# Patient Record
Sex: Female | Born: 1985
Health system: Southern US, Community
[De-identification: ages and names within clinical notes are randomized; demographics above are authoritative.]

## PROBLEM LIST (undated history)

## (undated) DIAGNOSIS — G932 Benign intracranial hypertension: Secondary | ICD-10-CM

## (undated) DIAGNOSIS — R55 Syncope and collapse: Secondary | ICD-10-CM

## (undated) HISTORY — PX: CSF SHUNT: SHX92

---

## 2011-07-17 ENCOUNTER — Ambulatory Visit: Payer: Self-pay | Admitting: Emergency Medicine

## 2016-02-23 DIAGNOSIS — R6889 Other general symptoms and signs: Secondary | ICD-10-CM | POA: Diagnosis not present

## 2016-02-23 DIAGNOSIS — Z111 Encounter for screening for respiratory tuberculosis: Secondary | ICD-10-CM | POA: Diagnosis not present

## 2016-02-23 DIAGNOSIS — R569 Unspecified convulsions: Secondary | ICD-10-CM | POA: Diagnosis not present

## 2016-03-04 DIAGNOSIS — R109 Unspecified abdominal pain: Secondary | ICD-10-CM | POA: Diagnosis not present

## 2016-03-04 DIAGNOSIS — R110 Nausea: Secondary | ICD-10-CM | POA: Diagnosis not present

## 2016-03-04 DIAGNOSIS — R1084 Generalized abdominal pain: Secondary | ICD-10-CM | POA: Diagnosis not present

## 2016-03-04 DIAGNOSIS — F17210 Nicotine dependence, cigarettes, uncomplicated: Secondary | ICD-10-CM | POA: Diagnosis not present

## 2016-03-04 DIAGNOSIS — R1013 Epigastric pain: Secondary | ICD-10-CM | POA: Diagnosis not present

## 2016-03-04 DIAGNOSIS — K529 Noninfective gastroenteritis and colitis, unspecified: Secondary | ICD-10-CM | POA: Diagnosis not present

## 2016-03-04 DIAGNOSIS — R509 Fever, unspecified: Secondary | ICD-10-CM | POA: Diagnosis not present

## 2016-03-04 DIAGNOSIS — R0602 Shortness of breath: Secondary | ICD-10-CM | POA: Diagnosis not present

## 2016-03-04 DIAGNOSIS — Z982 Presence of cerebrospinal fluid drainage device: Secondary | ICD-10-CM | POA: Diagnosis not present

## 2016-03-04 DIAGNOSIS — K6389 Other specified diseases of intestine: Secondary | ICD-10-CM | POA: Diagnosis not present

## 2016-03-04 DIAGNOSIS — R10816 Epigastric abdominal tenderness: Secondary | ICD-10-CM | POA: Diagnosis not present

## 2016-05-29 DIAGNOSIS — N938 Other specified abnormal uterine and vaginal bleeding: Secondary | ICD-10-CM | POA: Diagnosis not present

## 2016-05-29 DIAGNOSIS — M25551 Pain in right hip: Secondary | ICD-10-CM | POA: Diagnosis not present

## 2016-05-29 DIAGNOSIS — D539 Nutritional anemia, unspecified: Secondary | ICD-10-CM | POA: Diagnosis not present

## 2016-06-02 DIAGNOSIS — N938 Other specified abnormal uterine and vaginal bleeding: Secondary | ICD-10-CM | POA: Diagnosis not present

## 2016-06-02 DIAGNOSIS — M25851 Other specified joint disorders, right hip: Secondary | ICD-10-CM | POA: Diagnosis not present

## 2016-06-02 DIAGNOSIS — M25551 Pain in right hip: Secondary | ICD-10-CM | POA: Diagnosis not present

## 2016-06-02 DIAGNOSIS — M160 Bilateral primary osteoarthritis of hip: Secondary | ICD-10-CM | POA: Diagnosis not present

## 2016-06-02 DIAGNOSIS — N83292 Other ovarian cyst, left side: Secondary | ICD-10-CM | POA: Diagnosis not present

## 2016-07-05 ENCOUNTER — Encounter: Payer: Self-pay | Admitting: *Deleted

## 2016-07-05 ENCOUNTER — Ambulatory Visit
Admission: EM | Admit: 2016-07-05 | Discharge: 2016-07-05 | Disposition: A | Discharge status: Home / Self Care | Payer: Medicare Other | Attending: Family Medicine | Admitting: Family Medicine

## 2016-07-05 DIAGNOSIS — R05 Cough: Secondary | ICD-10-CM

## 2016-07-05 DIAGNOSIS — R0981 Nasal congestion: Secondary | ICD-10-CM | POA: Diagnosis not present

## 2016-07-05 DIAGNOSIS — R0602 Shortness of breath: Secondary | ICD-10-CM

## 2016-07-05 DIAGNOSIS — J45901 Unspecified asthma with (acute) exacerbation: Secondary | ICD-10-CM

## 2016-07-05 HISTORY — DX: Syncope and collapse: R55

## 2016-07-05 HISTORY — DX: Benign intracranial hypertension: G93.2

## 2016-07-05 MED ORDER — PREDNISONE 10 MG PO TABS: ORAL_TABLET | ORAL | 0 refills | Status: DC

## 2016-07-05 MED ORDER — IPRATROPIUM-ALBUTEROL 0.5-2.5 (3) MG/3ML IN SOLN
3.0000 mL | Freq: Four times a day (QID) | RESPIRATORY_TRACT | Status: DC
  Administered 2016-07-05: 3 mL via RESPIRATORY_TRACT

## 2016-07-05 MED ORDER — BENZONATATE 100 MG PO CAPS: 100.0000 mg | ORAL_CAPSULE | Freq: Three times a day (TID) | ORAL | 0 refills | Status: DC | PRN

## 2016-07-05 MED ORDER — ALBUTEROL SULFATE HFA 108 (90 BASE) MCG/ACT IN AERS: 2.0000 | INHALATION_SPRAY | RESPIRATORY_TRACT | 0 refills | Status: DC | PRN

## 2016-07-05 MED ORDER — HYDROCOD POLST-CPM POLST ER 10-8 MG/5ML PO SUER: 5.0000 mL | Freq: Every evening | ORAL | 0 refills | Status: DC | PRN

## 2016-07-05 NOTE — ED Triage Notes (Signed)
Patient started having symptoms of chest congestion, cough, and SOB 2 days ago. Patient has a history of breathing issues.

## 2016-07-05 NOTE — Discharge Instructions (Signed)
Take medication as prescribed. Rest. Drink plenty of fluids.  ° °Follow up with your primary care physician this week as needed. Return to Urgent care for new or worsening concerns.  ° °

## 2016-07-05 NOTE — ED Provider Notes (Signed)
[facility] ____________________________________________  Time seen: Approximately 10:23 AM  I have reviewed the triage vital signs and the nursing notes.   HISTORY  Chief Complaint Cough and Shortness of Breath   HPI Kristi Li is a 31 y.o. female  presents for evaluation of cough, wheezing and shortness of breath. Patient reports the symptoms started on Monday afternoon while she was at work. 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 States unsure if this is definitely a trigger. Patient reports coughing has continued with intermittent wheezing. Patient states that she has some soreness in her chest from coughing that is tender with direct palpation. Denies pain to breathe. Reports has had a history of similar in the past with asthmatic bronchitis. Reports she is having some nasal congestion and some ear congestion and ear discomfort. Denies sore throat. Denies fevers. States cough is primarily a dry hacking cough. States no over-the-counter medications for the same complaints. States did not have albuterol inhaler at home. Denies current shortness of breath. Reports mild soreness to needed to bilateral anterior chest with straight palpation as well as with coughing. Denies pain radiation or paresthesias.  Denies chest pain, abdominal pain, dysuria, extremity pain, extremity swelling or rash. Denies recent sickness. Denies recent antibiotic use.   Patient's last menstrual period was 07/04/2016. Denies pregnancy.  Shelbie Hutching, MD: PCP   Past Medical History:  Diagnosis Date  . Pseudotumor cerebri   . Vasovagal syncope     There are no active problems to display for this patient.   Past Surgical History:  Procedure Laterality Date  . CSF SHUNT        Current Facility-Administered Medications:  .  ipratropium-albuterol  (DUONEB) 0.5-2.5 (3) MG/3ML nebulizer solution 3 mL, 3 mL, Nebulization, Q6H, Marylene Land, NP, 3 mL at 07/05/16 1058 .  ipratropium-albuterol (DUONEB) 0.5-2.5 (3) MG/3ML nebulizer solution 3 mL, 3 mL, Nebulization, Q6H, Marylene Land, NP, 3 mL at 07/05/16 1047  Current Outpatient Prescriptions:  .  albuterol (PROVENTIL HFA;VENTOLIN HFA) 108 (90 Base) MCG/ACT inhaler, Inhale 2 puffs into the lungs every 4 (four) hours as needed for wheezing., Disp: 1 Inhaler, Rfl: 0 .  benzonatate (TESSALON PERLES) 100 MG capsule, Take 1 capsule (100 mg total) by mouth 3 (three) times daily as needed for cough., Disp: 15 capsule, Rfl: 0 .  chlorpheniramine-HYDROcodone (TUSSIONEX PENNKINETIC ER) 10-8 MG/5ML SUER, Take 5 mLs by mouth at bedtime as needed for cough. do not drive or operate machinery while taking as can cause drowsiness., Disp: 75 mL, Rfl: 0 .  predniSONE (DELTASONE) 10 MG tablet, Start 60 mg po day one, then 50 mg po day two, taper by 10 mg daily until complete., Disp: 21 tablet, Rfl: 0  Allergies Doxycycline  History reviewed. No pertinent family history.  Social History Social History  Substance Use Topics  . Smoking status: Current Every Day Smoker    Types: Cigarettes  . Smokeless tobacco: Never Used  . Alcohol use Yes    Review of Systems Constitutional: No fever/chills Eyes: No visual changes. ENT: No sore throat. Cardiovascular: Denies chest pain. Respiratory: As above.  Gastrointestinal: No abdominal pain.  No nausea, no vomiting.  No diarrhea.  No constipation. Genitourinary: Negative for dysuria. Musculoskeletal: Negative for back pain. Skin: Negative for rash.   ____________________________________________   PHYSICAL EXAM:  VITAL SIGNS: ED Triage Vitals  Enc Vitals Group  BP 07/05/16 1014 126/67     Pulse Rate 07/05/16 1014 87     Resp 07/05/16 1014 18     Temp 07/05/16 1014 99 F (37.2 C)     Temp Source 07/05/16 1014 Oral     SpO2 07/05/16 1014 100 %      Weight 07/05/16 1014 213 lb (96.6 kg)     Height 07/05/16 1014 5\' 3"  (1.6 m)     Head Circumference --      Peak Flow --      Pain Score 07/05/16 1016 6     Pain Loc --      Pain Edu? --      Excl. in [place]? --     Constitutional: Alert and oriented. Well appearing and in no acute distress. Eyes: Conjunctivae are normal. PERRL. EOMI. Head: Atraumatic. No sinus tenderness to palpation. No swelling. No erythema.  Ears: no erythema, normal TMs bilaterally.   Nose:Nasal congestion with clear rhinorrhea  Mouth/Throat: Mucous membranes are moist. No pharyngeal erythema. No tonsillar swelling or exudate.  Neck: No stridor.  No cervical spine tenderness to palpation. Hematological/Lymphatic/Immunilogical: No cervical lymphadenopathy. Cardiovascular: Normal rate, regular rhythm. Grossly normal heart sounds.  Good peripheral circulation. Respiratory: Normal respiratory effort.  No retractions. Good air movement. Scattered inspiratory wheezes throughout. No rhonchi or rales. Dry intermittent cough noted in room with bronchospasm. Gastrointestinal: Soft and nontender Musculoskeletal: Ambulatory with steady gait. No cervical, thoracic or lumbar tenderness to palpation. Bilateral lower extremities nontender no edema noted. Bilateral pedal pulses equal and easily palpated. Neurologic:  Normal speech and language. No gait instability. Skin:  Skin appears warm, dry and intact. No rash noted. Psychiatric: Mood and affect are normal. Speech and behavior are normal.  Well's score 0.  ___________________________________________   LABS (all labs ordered are listed, but only abnormal results are displayed)  Labs Reviewed - No data to display ____________________________________________  PROCEDURES Procedures    INITIAL IMPRESSION / ASSESSMENT AND PLAN / ED COURSE  Pertinent labs & imaging results that were available during my care of the patient were reviewed by me and considered in my medical  decision making (see chart for details).  Well-appearing patient. No acute distress. 2 days of cough and wheezing. Suspect asthmatic bronchitis secondary to asthma history and unsure if triggered by aerosols Monday at work or if her viral illness. Wells score 0. Discussed with patient evaluation of d-dimer, patient declines. After 2 DuoNeb in urgent care, wheezes fully resolved and patient reports feeling much better. Will treat patient with oral prednisone taper, when necessary albuterol inhaler, when necessary Tessalon Perles and when necessary Tussionex at night. Reports note given for today. Encouraged rest, fluids and supportive care. Discussed strict follow-up and return parameters.Discussed indication, risks and benefits of medications with patient.  Discussed follow up with Primary care physician this week. Discussed follow up and return parameters including no resolution or any worsening concerns. Patient verbalized understanding and agreed to plan.   ____________________________________________   FINAL CLINICAL IMPRESSION(S) / ED DIAGNOSES  Final diagnoses:  Exacerbation of asthma, unspecified asthma severity, unspecified whether persistent  Nasal congestion     Discharge Medication List as of 07/05/2016 11:29 AM    START taking these medications   Details  albuterol (PROVENTIL HFA;VENTOLIN HFA) 108 (90 Base) MCG/ACT inhaler Inhale 2 puffs into the lungs every 4 (four) hours as needed for wheezing., Starting Wed 07/05/2016, Normal    benzonatate (TESSALON PERLES) 100 MG capsule Take 1 capsule (100 mg total)  by mouth 3 (three) times daily as needed for cough., Starting Wed 07/05/2016, Normal    chlorpheniramine-HYDROcodone (TUSSIONEX PENNKINETIC ER) 10-8 MG/5ML SUER Take 5 mLs by mouth at bedtime as needed for cough. do not drive or operate machinery while taking as can cause drowsiness., Starting Wed 07/05/2016, Print    predniSONE (DELTASONE) 10 MG tablet Start 60 mg po day one,  then 50 mg po day two, taper by 10 mg daily until complete., Normal        Note: This dictation was prepared with Dragon dictation along with smaller phrase technology. Any transcriptional errors that result from this process are unintentional.         Marylene Land, NP 07/05/16 1155

## 2016-09-27 DIAGNOSIS — D392 Neoplasm of uncertain behavior of placenta: Secondary | ICD-10-CM | POA: Diagnosis not present

## 2016-09-27 DIAGNOSIS — N939 Abnormal uterine and vaginal bleeding, unspecified: Secondary | ICD-10-CM | POA: Diagnosis not present

## 2016-10-10 DIAGNOSIS — M7061 Trochanteric bursitis, right hip: Secondary | ICD-10-CM | POA: Diagnosis not present

## 2016-10-10 DIAGNOSIS — M1611 Unilateral primary osteoarthritis, right hip: Secondary | ICD-10-CM | POA: Diagnosis not present

## 2016-10-12 DIAGNOSIS — Z3042 Encounter for surveillance of injectable contraceptive: Secondary | ICD-10-CM | POA: Diagnosis not present

## 2017-01-11 DIAGNOSIS — G932 Benign intracranial hypertension: Secondary | ICD-10-CM | POA: Diagnosis not present

## 2017-01-11 DIAGNOSIS — H47292 Other optic atrophy, left eye: Secondary | ICD-10-CM | POA: Diagnosis not present

## 2017-01-16 DIAGNOSIS — M25551 Pain in right hip: Secondary | ICD-10-CM | POA: Diagnosis not present

## 2017-01-16 DIAGNOSIS — Z3042 Encounter for surveillance of injectable contraceptive: Secondary | ICD-10-CM | POA: Diagnosis not present

## 2017-01-29 DIAGNOSIS — M25551 Pain in right hip: Secondary | ICD-10-CM | POA: Diagnosis not present

## 2017-01-29 DIAGNOSIS — M25561 Pain in right knee: Secondary | ICD-10-CM | POA: Diagnosis not present

## 2017-05-15 ENCOUNTER — Encounter: Payer: Self-pay | Admitting: *Deleted

## 2017-05-15 ENCOUNTER — Ambulatory Visit
Admission: EM | Admit: 2017-05-15 | Discharge: 2017-05-15 | Disposition: A | Discharge status: Home / Self Care | Payer: Medicare Other | Attending: Family Medicine | Admitting: Family Medicine

## 2017-05-15 DIAGNOSIS — M436 Torticollis: Secondary | ICD-10-CM | POA: Diagnosis not present

## 2017-05-15 MED ORDER — METAXALONE 800 MG PO TABS: 800.0000 mg | ORAL_TABLET | Freq: Three times a day (TID) | ORAL | 0 refills | Status: DC

## 2017-05-15 MED ORDER — DIAZEPAM 2 MG PO TABS: 2.0000 mg | ORAL_TABLET | Freq: Three times a day (TID) | ORAL | 0 refills | Status: DC

## 2017-05-15 MED ORDER — NAPROXEN 500 MG PO TABS: 500.0000 mg | ORAL_TABLET | Freq: Two times a day (BID) | ORAL | 0 refills | Status: DC

## 2017-05-15 MED ORDER — KETOROLAC TROMETHAMINE 60 MG/2ML IM SOLN
60.0000 mg | Freq: Once | INTRAMUSCULAR | Status: AC
  Administered 2017-05-15: 60 mg via INTRAMUSCULAR

## 2017-05-15 NOTE — ED Triage Notes (Signed)
Onset of thoracic region back pain this am. Denies injury.

## 2017-05-15 NOTE — ED Provider Notes (Signed)
[facility]    CSN: [id number] Arrival date & time: 05/15/17  1241     History   Chief Complaint Chief Complaint  Patient presents with  . Back Pain    HPI Kristi Li is a 32 y.o. female.   HPI  32 year old female presents with neck pain radiating to her right trapezius and infrascapularly along the medial scapular border of the right.  Does not remember any specific injury to her neck.  Does sleep with her 14-year-old daughter.  Works Engineer, agricultural at [employer].  Mostly on the right side.  States that she has a spasm in the right trapezius.  Any motion of the neck is very painful.  She has a decided leftward list.  Has a history of pseudotumor cerebri.  He denies any upper extremity radicular symptoms.                 Past Medical History:  Diagnosis Date  . Pseudotumor cerebri   . Vasovagal syncope     There are no active problems to display for this patient.   Past Surgical History:  Procedure Laterality Date  . CSF SHUNT      OB History   None      Home Medications    Prior to Admission medications   Medication Sig Start Date End Date Taking? Authorizing Provider  albuterol (PROVENTIL HFA;VENTOLIN HFA) 108 (90 Base) MCG/ACT inhaler Inhale 2 puffs into the lungs every 4 (four) hours as needed for wheezing. 07/05/16  Yes Marylene Land, NP  diazepam (VALIUM) 2 MG tablet Take 1 tablet (2 mg total) by mouth 3 (three) times daily. Take for muscle spasm first then begin taking Skelaxin afterwards 05/15/17   Lorin Picket, PA-C  metaxalone (SKELAXIN) 800 MG tablet Take 1 tablet (800 mg total) by mouth 3 (three) times daily. 05/15/17   Lorin Picket, PA-C  naproxen (NAPROSYN) 500 MG tablet Take 1 tablet (500 mg total) by mouth 2 (two) times daily with a meal. 05/15/17   Lorin Picket, PA-C    Family History Family History  Problem Relation Age of Onset  . Other Mother   . Sickle cell anemia Father     Social  History Social History   Tobacco Use  . Smoking status: Current Every Day Smoker    Types: Cigarettes  . Smokeless tobacco: Never Used  Substance Use Topics  . Alcohol use: Yes  . Drug use: No     Allergies   Doxycycline   Review of Systems Review of Systems  Constitutional: Positive for activity change. Negative for appetite change, chills, fatigue and fever.  Musculoskeletal: Positive for neck pain and neck stiffness.  All other systems reviewed and are negative.    Physical Exam Triage Vital Signs ED Triage Vitals  Enc Vitals Group     BP 05/15/17 1257 135/87     Pulse Rate 05/15/17 1257 60     Resp 05/15/17 1257 16     Temp 05/15/17 1257 99 F (37.2 C)     Temp Source 05/15/17 1257 Oral     SpO2 05/15/17 1257 100 %     Weight 05/15/17 1259 218 lb (98.9 kg)     Height 05/15/17 1259 5\' 3"  (1.6 m)     Head Circumference --      Peak Flow --      Pain Score 05/15/17 1259 10     Pain Loc --      Pain  Edu? --      Excl. in [place]? --    No data found.  Updated Vital Signs BP 135/87 (BP Location: Left Arm)   Pulse 60   Temp 99 F (37.2 C) (Oral)   Resp 16   Ht 5\' 3"  (1.6 m)   Wt 218 lb (98.9 kg)   SpO2 100%   BMI 38.62 kg/m   Visual Acuity Right Eye Distance:   Left Eye Distance:   Bilateral Distance:    Right Eye Near:   Left Eye Near:    Bilateral Near:     Physical Exam  Constitutional: She is oriented to person, place, and time. She appears well-developed and well-nourished. No distress.  HENT:  Head: Normocephalic.  Eyes: Pupils are equal, round, and reactive to light. Right eye exhibits no discharge. Left eye exhibits no discharge.  Neck:  Has a leftward list in the sitting position.  Range of motion is very uncomfortable and she has decreased range in all planes.  He has marked muscle spasm of the trapezius and painful tenderness along the right medial scapular border.  Strength is intact in the upper extremities.  She has a nondermatomal  stocking distribution decrease in sensation on the right.  Upper extremity DTRs equal and bilaterally symmetrical.  Musculoskeletal: Normal range of motion.  Neurological: She is alert and oriented to person, place, and time.  Skin: Skin is warm and dry. She is not diaphoretic.  Psychiatric: She has a normal mood and affect. Her behavior is normal. Judgment and thought content normal.  Nursing note and vitals reviewed.    UC Treatments / Results  Labs (all labs ordered are listed, but only abnormal results are displayed) Labs Reviewed - No data to display  EKG None Radiology No results found.  Procedures Procedures (including critical care time)  Medications Ordered in UC Medications  ketorolac (TORADOL) injection 60 mg (60 mg Intramuscular Given 05/15/17 1334)     Initial Impression / Assessment and Plan / UC Course  I have reviewed the triage vital signs and the nursing notes.  Pertinent labs & imaging results that were available during my care of the patient were reviewed by me and considered in my medical decision making (see chart for details).     Plan: 1. Test/x-ray results and diagnosis reviewed with patient 2. rx as per orders; risks, benefits, potential side effects reviewed with patient 3. Recommend supportive treatment with rest and symptom avoidance.  Use ice alternating with heat 20 minutes out of every 2 hours 3-4 times daily.  Begin using Valium 3 times daily for 2 days then switching over toSkelaxin thereafter.  Cautioned regarding use of these medications while performing activities requiring concentration or judgment and not driving while taking them.  Also provide her with anti-inflammatory medications.  If she is not improving she should follow-up with her primary care physician.  If she worsens she should go to the emergency room 4. F/u prn if symptoms worsen or don't improve   Final Clinical Impressions(s) / UC Diagnoses   Final diagnoses:   Torticollis, acute    ED Discharge Orders        Ordered    diazepam (VALIUM) 2 MG tablet  3 times daily     05/15/17 1333    metaxalone (SKELAXIN) 800 MG tablet  3 times daily     05/15/17 1333    naproxen (NAPROSYN) 500 MG tablet  2 times daily with meals     05/15/17 1333  Controlled Substance Prescriptions [place] Controlled Substance Registry consulted? Not Applicable   Lorin Picket, PA-C 05/15/17 1354

## 2017-05-22 ENCOUNTER — Telehealth: Payer: Self-pay | Admitting: Family Medicine

## 2017-05-22 MED ORDER — CYCLOBENZAPRINE HCL 10 MG PO TABS: 10.0000 mg | ORAL_TABLET | Freq: Three times a day (TID) | ORAL | 1 refills | Status: DC | PRN

## 2017-05-22 NOTE — Telephone Encounter (Signed)
Could not afford Skelaxin. Sending in [place].   [facility]

## 2017-06-04 DIAGNOSIS — M25511 Pain in right shoulder: Secondary | ICD-10-CM | POA: Diagnosis not present

## 2017-06-04 DIAGNOSIS — R200 Anesthesia of skin: Secondary | ICD-10-CM | POA: Diagnosis not present

## 2017-06-04 DIAGNOSIS — F17210 Nicotine dependence, cigarettes, uncomplicated: Secondary | ICD-10-CM | POA: Diagnosis not present

## 2017-06-04 DIAGNOSIS — I10 Essential (primary) hypertension: Secondary | ICD-10-CM | POA: Diagnosis not present

## 2017-06-04 DIAGNOSIS — G5621 Lesion of ulnar nerve, right upper limb: Secondary | ICD-10-CM | POA: Diagnosis not present

## 2017-06-04 DIAGNOSIS — J45909 Unspecified asthma, uncomplicated: Secondary | ICD-10-CM | POA: Diagnosis not present

## 2017-06-04 DIAGNOSIS — M546 Pain in thoracic spine: Secondary | ICD-10-CM | POA: Diagnosis not present

## 2017-06-27 DIAGNOSIS — M25511 Pain in right shoulder: Secondary | ICD-10-CM | POA: Diagnosis not present

## 2017-06-27 DIAGNOSIS — M50322 Other cervical disc degeneration at C5-C6 level: Secondary | ICD-10-CM | POA: Diagnosis not present

## 2017-06-27 DIAGNOSIS — M5412 Radiculopathy, cervical region: Secondary | ICD-10-CM | POA: Diagnosis not present

## 2017-06-27 DIAGNOSIS — M50122 Cervical disc disorder at C5-C6 level with radiculopathy: Secondary | ICD-10-CM | POA: Diagnosis not present

## 2017-07-02 DIAGNOSIS — M5412 Radiculopathy, cervical region: Secondary | ICD-10-CM | POA: Diagnosis not present

## 2017-07-04 DIAGNOSIS — M50123 Cervical disc disorder at C6-C7 level with radiculopathy: Secondary | ICD-10-CM | POA: Diagnosis not present

## 2017-07-04 DIAGNOSIS — M5011 Cervical disc disorder with radiculopathy,  high cervical region: Secondary | ICD-10-CM | POA: Diagnosis not present

## 2017-07-18 DIAGNOSIS — M5412 Radiculopathy, cervical region: Secondary | ICD-10-CM | POA: Diagnosis not present

## 2017-07-25 DIAGNOSIS — M5412 Radiculopathy, cervical region: Secondary | ICD-10-CM | POA: Diagnosis not present

## 2017-08-01 DIAGNOSIS — M5412 Radiculopathy, cervical region: Secondary | ICD-10-CM | POA: Diagnosis not present

## 2017-09-14 DIAGNOSIS — M5412 Radiculopathy, cervical region: Secondary | ICD-10-CM | POA: Diagnosis not present

## 2017-09-19 DIAGNOSIS — N939 Abnormal uterine and vaginal bleeding, unspecified: Secondary | ICD-10-CM | POA: Diagnosis not present

## 2017-09-19 DIAGNOSIS — Z8349 Family history of other endocrine, nutritional and metabolic diseases: Secondary | ICD-10-CM | POA: Diagnosis not present

## 2017-09-19 DIAGNOSIS — Z3042 Encounter for surveillance of injectable contraceptive: Secondary | ICD-10-CM | POA: Diagnosis not present

## 2017-09-19 DIAGNOSIS — A6000 Herpesviral infection of urogenital system, unspecified: Secondary | ICD-10-CM | POA: Diagnosis not present

## 2017-09-27 DIAGNOSIS — R9389 Abnormal findings on diagnostic imaging of other specified body structures: Secondary | ICD-10-CM | POA: Diagnosis not present

## 2017-09-27 DIAGNOSIS — N83291 Other ovarian cyst, right side: Secondary | ICD-10-CM | POA: Diagnosis not present

## 2017-09-27 DIAGNOSIS — N83201 Unspecified ovarian cyst, right side: Secondary | ICD-10-CM | POA: Diagnosis not present

## 2017-09-27 DIAGNOSIS — N858 Other specified noninflammatory disorders of uterus: Secondary | ICD-10-CM | POA: Diagnosis not present

## 2017-09-27 DIAGNOSIS — N939 Abnormal uterine and vaginal bleeding, unspecified: Secondary | ICD-10-CM | POA: Diagnosis not present

## 2017-10-09 DIAGNOSIS — M50222 Other cervical disc displacement at C5-C6 level: Secondary | ICD-10-CM | POA: Diagnosis not present

## 2017-10-09 DIAGNOSIS — Z7952 Long term (current) use of systemic steroids: Secondary | ICD-10-CM | POA: Diagnosis not present

## 2017-10-09 DIAGNOSIS — R262 Difficulty in walking, not elsewhere classified: Secondary | ICD-10-CM | POA: Diagnosis not present

## 2017-10-09 DIAGNOSIS — I272 Pulmonary hypertension, unspecified: Secondary | ICD-10-CM | POA: Diagnosis not present

## 2017-10-09 DIAGNOSIS — Z79899 Other long term (current) drug therapy: Secondary | ICD-10-CM | POA: Diagnosis not present

## 2017-10-09 DIAGNOSIS — G932 Benign intracranial hypertension: Secondary | ICD-10-CM | POA: Diagnosis not present

## 2017-10-09 DIAGNOSIS — F17210 Nicotine dependence, cigarettes, uncomplicated: Secondary | ICD-10-CM | POA: Diagnosis not present

## 2017-10-09 DIAGNOSIS — M4722 Other spondylosis with radiculopathy, cervical region: Secondary | ICD-10-CM | POA: Diagnosis not present

## 2017-10-09 DIAGNOSIS — J45909 Unspecified asthma, uncomplicated: Secondary | ICD-10-CM | POA: Diagnosis not present

## 2017-10-09 DIAGNOSIS — M5412 Radiculopathy, cervical region: Secondary | ICD-10-CM | POA: Diagnosis not present

## 2017-11-01 ENCOUNTER — Other Ambulatory Visit: Payer: Self-pay

## 2017-11-01 ENCOUNTER — Ambulatory Visit
Admission: EM | Admit: 2017-11-01 | Discharge: 2017-11-01 | Disposition: A | Discharge status: Home / Self Care | Payer: Medicare Other | Attending: Emergency Medicine | Admitting: Emergency Medicine

## 2017-11-01 ENCOUNTER — Encounter: Payer: Self-pay | Admitting: Emergency Medicine

## 2017-11-01 DIAGNOSIS — M5412 Radiculopathy, cervical region: Secondary | ICD-10-CM | POA: Diagnosis not present

## 2017-11-01 MED ORDER — NAPROXEN 500 MG PO TABS: 500.0000 mg | ORAL_TABLET | Freq: Two times a day (BID) | ORAL | 0 refills | Status: DC

## 2017-11-01 MED ORDER — KETOROLAC TROMETHAMINE 60 MG/2ML IM SOLN
60.0000 mg | Freq: Once | INTRAMUSCULAR | Status: AC
  Administered 2017-11-01: 60 mg via INTRAMUSCULAR

## 2017-11-01 MED ORDER — TIZANIDINE HCL 4 MG PO CAPS: 4.0000 mg | ORAL_CAPSULE | Freq: Three times a day (TID) | ORAL | 0 refills | Status: DC

## 2017-11-01 NOTE — ED Provider Notes (Signed)
[facility]    CSN: [id number] Arrival date & time: 11/01/17  0920     History   Chief Complaint Chief Complaint  Patient presents with  . Spasms    HPI Kristi Li is a 32 y.o. female.   HPI  -year-old female with known to our practice's with neck muscle spasm with radiation into her right trapezius and intrascapular region.  Have radicular symptoms with the numbness and tingling into her ring and middle fingers.  She was last seen April 2019 [place] symptoms.  Diagnosis of torticollis and she was given muscle relaxers and anti-inflammatories.  This helped her.  She was evaluated by a "expert" in the interim and was told that she will likely require anterior cervical discectomy and fusion at the C6-7 level.  Her distribution is that of C 7 radiculopathy.  Works in housekeeping at [employer]        Past Medical History:  Diagnosis Date  . Pseudotumor cerebri   . Vasovagal syncope     There are no active problems to display for this patient.   Past Surgical History:  Procedure Laterality Date  . CSF SHUNT      OB History   None      Home Medications    Prior to Admission medications   Medication Sig Start Date End Date Taking? Authorizing Provider  albuterol (PROVENTIL HFA;VENTOLIN HFA) 108 (90 Base) MCG/ACT inhaler Inhale 2 puffs into the lungs every 4 (four) hours as needed for wheezing. 07/05/16  Yes Marylene Land, NP  naproxen (NAPROSYN) 500 MG tablet Take 1 tablet (500 mg total) by mouth 2 (two) times daily with a meal. 11/01/17   Lorin Picket, PA-C  tiZANidine (ZANAFLEX) 4 MG capsule Take 1 capsule (4 mg total) by mouth 3 (three) times daily. 11/01/17   Lorin Picket, PA-C    Family History Family History  Problem Relation Age of Onset  . Other Mother   . Sickle cell anemia Father     Social History Social History   Tobacco Use  . Smoking status: Current Every Day Smoker    Types: Cigarettes  .  Smokeless tobacco: Never Used  Substance Use Topics  . Alcohol use: Yes    Comment: socially  . Drug use: No     Allergies   Doxycycline   Review of Systems Review of Systems  Constitutional: Positive for activity change. Negative for appetite change, chills, fatigue and fever.  Musculoskeletal: Positive for myalgias, neck pain and neck stiffness.     Physical Exam Triage Vital Signs ED Triage Vitals [11/01/17 0943]  Enc Vitals Group     BP 131/72     Pulse Rate 77     Resp 18     Temp 98.6 F (37 C)     Temp Source Oral     SpO2 100 %     Weight 230 lb (104.3 kg)     Height 5\' 2"  (1.575 m)     Head Circumference      Peak Flow      Pain Score 10     Pain Loc      Pain Edu?      Excl. in [place]?    No data found.  Updated Vital Signs BP 131/72 (BP Location: Left Arm)   Pulse 77   Temp 98.6 F (37 C) (Oral)   Resp 18   Ht 5\' 2"  (1.575 m)   Wt 230  lb (104.3 kg)   SpO2 100%   BMI 42.07 kg/m   Visual Acuity Right Eye Distance:   Left Eye Distance:   Bilateral Distance:    Right Eye Near:   Left Eye Near:    Bilateral Near:     Physical Exam  Constitutional: She is oriented to person, place, and time. She appears well-developed and well-nourished. No distress.  HENT:  Head: Normocephalic.  Eyes: Pupils are equal, round, and reactive to light. EOM are normal. Right eye exhibits no discharge. Left eye exhibits no discharge.  Musculoskeletal: She exhibits tenderness.  Neurological: She is alert and oriented to person, place, and time.  Skin: Skin is warm and dry. She is not diaphoretic.  Psychiatric: She has a normal mood and affect. Her behavior is normal. Judgment and thought content normal.  Nursing note and vitals reviewed.    UC Treatments / Results  Labs (all labs ordered are listed, but only abnormal results are displayed) Labs Reviewed - No data to display  EKG None  Radiology No results found.  Procedures Procedures (including  critical care time)  Medications Ordered in UC Medications  ketorolac (TORADOL) injection 60 mg (60 mg Intramuscular Given 11/01/17 1012)    Initial Impression / Assessment and Plan / UC Course  I have reviewed the triage vital signs and the nursing notes.  Pertinent labs & imaging results that were available during my care of the patient were reviewed by me and considered in my medical decision making (see chart for details).     Discussion with the patient . we will treat the symptoms that she currently has.  Did discuss the problem with compression of a nerve root for prolonged periods of time and losing the function of that nerve perhaps permanently.  She states  that the expert that she saw was trying to have her schedule surgery in October.  She is still considering this at this time.  Use ice initially on the area then use heat as necessary.  She is not improving I recommended she follow-up with the surgeon that she has been seeing. Final Clinical Impressions(s) / UC Diagnoses   Final diagnoses:  Cervical radiculitis   Discharge Instructions   None    ED Prescriptions    Medication Sig Dispense Auth. Provider   tiZANidine (ZANAFLEX) 4 MG capsule Take 1 capsule (4 mg total) by mouth 3 (three) times daily. 21 capsule Crecencio Mc P, PA-C   naproxen (NAPROSYN) 500 MG tablet Take 1 tablet (500 mg total) by mouth 2 (two) times daily with a meal. 60 tablet Lorin Picket, PA-C     Controlled Substance Prescriptions  Controlled Substance Registry consulted? Not Applicable   Lorin Picket, PA-C 11/01/17 1348

## 2017-11-01 NOTE — ED Triage Notes (Signed)
Patient c/o muscle spasm in her back that started this morning.

## 2017-11-09 DIAGNOSIS — F17210 Nicotine dependence, cigarettes, uncomplicated: Secondary | ICD-10-CM | POA: Diagnosis not present

## 2017-11-09 DIAGNOSIS — Z79899 Other long term (current) drug therapy: Secondary | ICD-10-CM | POA: Diagnosis not present

## 2017-11-09 DIAGNOSIS — Z8679 Personal history of other diseases of the circulatory system: Secondary | ICD-10-CM | POA: Diagnosis not present

## 2017-11-09 DIAGNOSIS — I498 Other specified cardiac arrhythmias: Secondary | ICD-10-CM | POA: Diagnosis not present

## 2017-11-09 DIAGNOSIS — Z01810 Encounter for preprocedural cardiovascular examination: Secondary | ICD-10-CM | POA: Diagnosis not present

## 2017-11-12 DIAGNOSIS — N939 Abnormal uterine and vaginal bleeding, unspecified: Secondary | ICD-10-CM | POA: Diagnosis not present

## 2017-11-23 DIAGNOSIS — Z8679 Personal history of other diseases of the circulatory system: Secondary | ICD-10-CM | POA: Diagnosis not present

## 2017-11-23 DIAGNOSIS — R011 Cardiac murmur, unspecified: Secondary | ICD-10-CM | POA: Diagnosis not present

## 2017-11-23 DIAGNOSIS — R0602 Shortness of breath: Secondary | ICD-10-CM | POA: Diagnosis not present

## 2017-12-11 DIAGNOSIS — Z982 Presence of cerebrospinal fluid drainage device: Secondary | ICD-10-CM | POA: Diagnosis not present

## 2017-12-11 DIAGNOSIS — J45909 Unspecified asthma, uncomplicated: Secondary | ICD-10-CM | POA: Diagnosis not present

## 2017-12-11 DIAGNOSIS — M50222 Other cervical disc displacement at C5-C6 level: Secondary | ICD-10-CM | POA: Diagnosis not present

## 2017-12-11 DIAGNOSIS — R569 Unspecified convulsions: Secondary | ICD-10-CM | POA: Diagnosis not present

## 2017-12-11 DIAGNOSIS — Z79899 Other long term (current) drug therapy: Secondary | ICD-10-CM | POA: Diagnosis not present

## 2017-12-11 DIAGNOSIS — F17210 Nicotine dependence, cigarettes, uncomplicated: Secondary | ICD-10-CM | POA: Diagnosis not present

## 2017-12-11 DIAGNOSIS — M79603 Pain in arm, unspecified: Secondary | ICD-10-CM | POA: Diagnosis not present

## 2017-12-11 DIAGNOSIS — G932 Benign intracranial hypertension: Secondary | ICD-10-CM | POA: Diagnosis not present

## 2017-12-24 DIAGNOSIS — M50123 Cervical disc disorder at C6-C7 level with radiculopathy: Secondary | ICD-10-CM | POA: Diagnosis not present

## 2017-12-24 DIAGNOSIS — Z982 Presence of cerebrospinal fluid drainage device: Secondary | ICD-10-CM | POA: Diagnosis not present

## 2017-12-24 DIAGNOSIS — D649 Anemia, unspecified: Secondary | ICD-10-CM | POA: Diagnosis not present

## 2017-12-24 DIAGNOSIS — Z981 Arthrodesis status: Secondary | ICD-10-CM | POA: Diagnosis not present

## 2017-12-24 DIAGNOSIS — J45909 Unspecified asthma, uncomplicated: Secondary | ICD-10-CM | POA: Diagnosis not present

## 2017-12-24 DIAGNOSIS — I270 Primary pulmonary hypertension: Secondary | ICD-10-CM | POA: Diagnosis not present

## 2017-12-24 DIAGNOSIS — R262 Difficulty in walking, not elsewhere classified: Secondary | ICD-10-CM | POA: Diagnosis not present

## 2017-12-24 DIAGNOSIS — Z9889 Other specified postprocedural states: Secondary | ICD-10-CM | POA: Diagnosis not present

## 2017-12-24 DIAGNOSIS — Z8679 Personal history of other diseases of the circulatory system: Secondary | ICD-10-CM | POA: Diagnosis not present

## 2017-12-24 DIAGNOSIS — G932 Benign intracranial hypertension: Secondary | ICD-10-CM | POA: Diagnosis not present

## 2017-12-24 DIAGNOSIS — G9520 Unspecified cord compression: Secondary | ICD-10-CM | POA: Diagnosis not present

## 2017-12-24 DIAGNOSIS — M50122 Cervical disc disorder at C5-C6 level with radiculopathy: Secondary | ICD-10-CM | POA: Diagnosis not present

## 2017-12-24 DIAGNOSIS — M4802 Spinal stenosis, cervical region: Secondary | ICD-10-CM | POA: Diagnosis not present

## 2017-12-24 DIAGNOSIS — F17210 Nicotine dependence, cigarettes, uncomplicated: Secondary | ICD-10-CM | POA: Diagnosis not present

## 2017-12-24 DIAGNOSIS — R569 Unspecified convulsions: Secondary | ICD-10-CM | POA: Diagnosis not present

## 2017-12-25 DIAGNOSIS — J45909 Unspecified asthma, uncomplicated: Secondary | ICD-10-CM | POA: Diagnosis not present

## 2017-12-25 DIAGNOSIS — M50122 Cervical disc disorder at C5-C6 level with radiculopathy: Secondary | ICD-10-CM | POA: Diagnosis not present

## 2017-12-25 DIAGNOSIS — I270 Primary pulmonary hypertension: Secondary | ICD-10-CM | POA: Diagnosis not present

## 2017-12-25 DIAGNOSIS — R569 Unspecified convulsions: Secondary | ICD-10-CM | POA: Diagnosis not present

## 2017-12-25 DIAGNOSIS — G9520 Unspecified cord compression: Secondary | ICD-10-CM | POA: Diagnosis not present

## 2017-12-25 DIAGNOSIS — M50123 Cervical disc disorder at C6-C7 level with radiculopathy: Secondary | ICD-10-CM | POA: Diagnosis not present

## 2018-01-04 DIAGNOSIS — M5412 Radiculopathy, cervical region: Secondary | ICD-10-CM | POA: Diagnosis not present

## 2018-01-04 DIAGNOSIS — H4710 Unspecified papilledema: Secondary | ICD-10-CM | POA: Diagnosis not present

## 2018-01-04 DIAGNOSIS — Z720 Tobacco use: Secondary | ICD-10-CM | POA: Diagnosis not present

## 2018-01-04 DIAGNOSIS — J4520 Mild intermittent asthma, uncomplicated: Secondary | ICD-10-CM | POA: Diagnosis not present

## 2018-01-04 DIAGNOSIS — Z881 Allergy status to other antibiotic agents status: Secondary | ICD-10-CM | POA: Diagnosis not present

## 2018-01-04 DIAGNOSIS — Z981 Arthrodesis status: Secondary | ICD-10-CM | POA: Diagnosis not present

## 2018-01-04 DIAGNOSIS — Z79899 Other long term (current) drug therapy: Secondary | ICD-10-CM | POA: Diagnosis not present

## 2018-01-04 DIAGNOSIS — R55 Syncope and collapse: Secondary | ICD-10-CM | POA: Diagnosis not present

## 2018-01-04 DIAGNOSIS — G932 Benign intracranial hypertension: Secondary | ICD-10-CM | POA: Diagnosis not present

## 2018-02-01 DIAGNOSIS — J45909 Unspecified asthma, uncomplicated: Secondary | ICD-10-CM | POA: Diagnosis not present

## 2018-02-01 DIAGNOSIS — Z08 Encounter for follow-up examination after completed treatment for malignant neoplasm: Secondary | ICD-10-CM | POA: Diagnosis not present

## 2018-02-01 DIAGNOSIS — F17210 Nicotine dependence, cigarettes, uncomplicated: Secondary | ICD-10-CM | POA: Diagnosis not present

## 2018-02-01 DIAGNOSIS — Z981 Arthrodesis status: Secondary | ICD-10-CM | POA: Diagnosis not present

## 2018-04-22 ENCOUNTER — Telehealth: Payer: Self-pay | Admitting: Emergency Medicine

## 2018-04-22 ENCOUNTER — Ambulatory Visit: Payer: No Typology Code available for payment source

## 2018-04-22 ENCOUNTER — Ambulatory Visit
Admission: EM | Admit: 2018-04-22 | Discharge: 2018-04-22 | Disposition: A | Discharge status: Home / Self Care | Payer: No Typology Code available for payment source | Attending: Family Medicine | Admitting: Family Medicine

## 2018-04-22 ENCOUNTER — Encounter: Payer: Self-pay | Admitting: Emergency Medicine

## 2018-04-22 ENCOUNTER — Other Ambulatory Visit: Payer: Self-pay

## 2018-04-22 DIAGNOSIS — S90112A Contusion of left great toe without damage to nail, initial encounter: Secondary | ICD-10-CM | POA: Diagnosis not present

## 2018-04-22 DIAGNOSIS — S99922A Unspecified injury of left foot, initial encounter: Secondary | ICD-10-CM | POA: Diagnosis not present

## 2018-04-22 DIAGNOSIS — W2209XA Striking against other stationary object, initial encounter: Secondary | ICD-10-CM | POA: Diagnosis not present

## 2018-04-22 DIAGNOSIS — M7989 Other specified soft tissue disorders: Secondary | ICD-10-CM | POA: Diagnosis not present

## 2018-04-22 DIAGNOSIS — M79675 Pain in left toe(s): Secondary | ICD-10-CM | POA: Diagnosis not present

## 2018-04-22 NOTE — ED Provider Notes (Signed)
[facility]    CSN: [id number] Arrival date & time: 04/22/18  0900     History   Chief Complaint Chief Complaint  Patient presents with  . Toe Pain    HPI Kristi Li is a 33 y.o. female.   33 yo female with a c/o left big toe pain after hitting it on a cart yesterday.   The history is provided by the patient.  Toe Pain     Past Medical History:  Diagnosis Date  . Pseudotumor cerebri   . Vasovagal syncope     There are no active problems to display for this patient.   Past Surgical History:  Procedure Laterality Date  . CSF SHUNT      OB History   No obstetric history on file.      Home Medications    Prior to Admission medications   Medication Sig Start Date End Date Taking? Authorizing Provider  albuterol (PROVENTIL HFA;VENTOLIN HFA) 108 (90 Base) MCG/ACT inhaler Inhale 2 puffs into the lungs every 4 (four) hours as needed for wheezing. 07/05/16   Marylene Land, NP  naproxen (NAPROSYN) 500 MG tablet Take 1 tablet (500 mg total) by mouth 2 (two) times daily with a meal. 11/01/17   Lorin Picket, PA-C  tiZANidine (ZANAFLEX) 4 MG capsule Take 1 capsule (4 mg total) by mouth 3 (three) times daily. 11/01/17   Lorin Picket, PA-C    Family History Family History  Problem Relation Age of Onset  . Other Mother   . Sickle cell anemia Father     Social History Social History   Tobacco Use  . Smoking status: Current Every Day Smoker    Types: Cigarettes  . Smokeless tobacco: Never Used  Substance Use Topics  . Alcohol use: Yes    Comment: socially  . Drug use: No     Allergies   Doxycycline   Review of Systems Review of Systems   Physical Exam Triage Vital Signs ED Triage Vitals  Enc Vitals Group     BP 04/22/18 0931 125/72     Pulse Rate 04/22/18 0931 79     Resp 04/22/18 0931 18     Temp 04/22/18 0931 98.4 F (36.9 C)     Temp Source 04/22/18 0931 Oral     SpO2 04/22/18 0931 100 %     Weight 04/22/18 0929  230 lb (104.3 kg)     Height 04/22/18 0929 5\' 2"  (1.575 m)     Head Circumference --      Peak Flow --      Pain Score 04/22/18 0928 8     Pain Loc --      Pain Edu? --      Excl. in [place]? --    No data found.  Updated Vital Signs BP 125/72 (BP Location: Right Arm)   Pulse 79   Temp 98.4 F (36.9 C) (Oral)   Resp 18   Ht 5\' 2"  (1.575 m)   Wt 104.3 kg   LMP 04/18/2018   SpO2 100%   BMI 42.07 kg/m   Visual Acuity Right Eye Distance:   Left Eye Distance:   Bilateral Distance:    Right Eye Near:   Left Eye Near:    Bilateral Near:     Physical Exam Vitals signs and nursing note reviewed.  Constitutional:      General: She is not in acute distress.    Appearance: She is not toxic-appearing or diaphoretic.  Musculoskeletal:     Left foot: Normal range of motion. Bony tenderness (over big toe) and swelling (mild) present.  Neurological:     Mental Status: She is alert.      UC Treatments / Results  Labs (all labs ordered are listed, but only abnormal results are displayed) Labs Reviewed - No data to display  EKG None  Radiology Dg Toe Great Left  Result Date: 04/22/2018 CLINICAL DATA:  Left toe pain post injury. EXAM: LEFT GREAT TOE COMPARISON:  None. FINDINGS: There is no evidence of fracture or dislocation. There is no evidence of arthropathy or other focal bone abnormality. Mild soft tissue swelling of the left great toe. IMPRESSION: No acute fracture or dislocation identified about the left great toe. Electronically Signed   By: Fidela Salisbury M.D.   On: 04/22/2018 10:24    Procedures Procedures (including critical care time)  Medications Ordered in UC Medications - No data to display  Initial Impression / Assessment and Plan / UC Course  I have reviewed the triage vital signs and the nursing notes.  Pertinent labs & imaging results that were available during my care of the patient were reviewed by me and considered in my medical decision making  (see chart for details).      Final Clinical Impressions(s) / UC Diagnoses   Final diagnoses:  Contusion of left great toe without damage to nail, initial encounter     Discharge Instructions     Rest, ice, over the counter tylenol/advil    ED Prescriptions    None     1. X-ray results (negative) and diagnosis reviewed with patient 2. Recommend supportive treatment as above 3. Follow-up prn if symptoms worsen or don't improve   Controlled Substance Prescriptions [place] Controlled Substance Registry consulted? Not Applicable   Norval Gable, MD 04/22/18 1158

## 2018-04-22 NOTE — Discharge Instructions (Signed)
Rest, ice, over the counter tylenol/advil 

## 2018-04-22 NOTE — Telephone Encounter (Signed)
Attempted to contact patient x 2. Patient voicemail not set up, unable to leave a message. Per Kristi Li, front desk staff, patient called back earlier in the day and stated she was supposed to be worker's comp. Patient did not disclose this information to myself, Dr. Zenda Alpers or the front desk staff checking her in during her visit. Patient will need to come back in for a drug screen if this is to be filed under worker's comp.

## 2018-04-22 NOTE — ED Triage Notes (Signed)
Patient stated she ran over her great toe on her left foot yesterday with a cart.

## 2018-07-03 DIAGNOSIS — J988 Other specified respiratory disorders: Secondary | ICD-10-CM | POA: Diagnosis not present

## 2018-07-03 DIAGNOSIS — R197 Diarrhea, unspecified: Secondary | ICD-10-CM | POA: Diagnosis not present

## 2018-07-03 DIAGNOSIS — R51 Headache: Secondary | ICD-10-CM | POA: Diagnosis not present

## 2018-07-03 DIAGNOSIS — Z20828 Contact with and (suspected) exposure to other viral communicable diseases: Secondary | ICD-10-CM | POA: Diagnosis not present

## 2018-07-03 DIAGNOSIS — F17200 Nicotine dependence, unspecified, uncomplicated: Secondary | ICD-10-CM | POA: Diagnosis not present

## 2018-07-29 DIAGNOSIS — G9520 Unspecified cord compression: Secondary | ICD-10-CM | POA: Diagnosis not present

## 2018-07-29 DIAGNOSIS — Z981 Arthrodesis status: Secondary | ICD-10-CM | POA: Diagnosis not present

## 2018-07-29 DIAGNOSIS — M542 Cervicalgia: Secondary | ICD-10-CM | POA: Diagnosis not present

## 2018-07-29 DIAGNOSIS — R200 Anesthesia of skin: Secondary | ICD-10-CM | POA: Diagnosis not present

## 2018-07-29 DIAGNOSIS — F17210 Nicotine dependence, cigarettes, uncomplicated: Secondary | ICD-10-CM | POA: Diagnosis not present

## 2018-07-29 DIAGNOSIS — M6289 Other specified disorders of muscle: Secondary | ICD-10-CM | POA: Diagnosis not present

## 2018-07-29 DIAGNOSIS — R202 Paresthesia of skin: Secondary | ICD-10-CM | POA: Diagnosis not present

## 2018-07-29 DIAGNOSIS — Z79899 Other long term (current) drug therapy: Secondary | ICD-10-CM | POA: Diagnosis not present

## 2018-08-12 DIAGNOSIS — F17210 Nicotine dependence, cigarettes, uncomplicated: Secondary | ICD-10-CM | POA: Diagnosis not present

## 2018-08-12 DIAGNOSIS — M50222 Other cervical disc displacement at C5-C6 level: Secondary | ICD-10-CM | POA: Diagnosis not present

## 2018-08-12 DIAGNOSIS — M4852XA Collapsed vertebra, not elsewhere classified, cervical region, initial encounter for fracture: Secondary | ICD-10-CM | POA: Diagnosis not present

## 2018-08-12 DIAGNOSIS — M4802 Spinal stenosis, cervical region: Secondary | ICD-10-CM | POA: Diagnosis not present

## 2018-08-12 DIAGNOSIS — Z981 Arthrodesis status: Secondary | ICD-10-CM | POA: Diagnosis not present

## 2018-08-21 DIAGNOSIS — Z981 Arthrodesis status: Secondary | ICD-10-CM | POA: Diagnosis not present

## 2018-08-21 DIAGNOSIS — M4802 Spinal stenosis, cervical region: Secondary | ICD-10-CM | POA: Diagnosis not present

## 2018-09-29 ENCOUNTER — Ambulatory Visit
Admission: EM | Admit: 2018-09-29 | Discharge: 2018-09-29 | Disposition: A | Discharge status: Home / Self Care | Payer: Medicare Other | Attending: Family Medicine | Admitting: Family Medicine

## 2018-09-29 ENCOUNTER — Ambulatory Visit: Payer: Medicare Other

## 2018-09-29 DIAGNOSIS — S46911A Strain of unspecified muscle, fascia and tendon at shoulder and upper arm level, right arm, initial encounter: Secondary | ICD-10-CM | POA: Diagnosis not present

## 2018-09-29 DIAGNOSIS — S29012A Strain of muscle and tendon of back wall of thorax, initial encounter: Secondary | ICD-10-CM | POA: Diagnosis not present

## 2018-09-29 DIAGNOSIS — M542 Cervicalgia: Secondary | ICD-10-CM | POA: Diagnosis not present

## 2018-09-29 MED ORDER — MELOXICAM 15 MG PO TABS: 15.0000 mg | ORAL_TABLET | Freq: Every day | ORAL | 0 refills | Status: DC

## 2018-09-29 MED ORDER — CYCLOBENZAPRINE HCL 10 MG PO TABS: 10.0000 mg | ORAL_TABLET | Freq: Every day | ORAL | 0 refills | Status: DC

## 2018-09-29 MED ORDER — HYDROCODONE-ACETAMINOPHEN 5-325 MG PO TABS: ORAL_TABLET | ORAL | 0 refills | Status: DC

## 2018-09-29 NOTE — Discharge Instructions (Addendum)
Rest, ice/heat,  anti-inflammatories Follow up at [facility] next week

## 2018-09-29 NOTE — ED Triage Notes (Signed)
Pt here for workers comp. States she has a hx of neck surgery and was taking out the trash on Friday and since then has been having right sided neck pain and shoulder. States the pain radiates from right side of her neck to her right hand.

## 2018-09-29 NOTE — ED Provider Notes (Addendum)
[facility]    CSN: [id number] Arrival date & time: 09/29/18  1400     History   Chief Complaint Chief Complaint  Patient presents with  . Work Related Injury    HPI Kristi Li is a 33 y.o. female.   33 yo female with a c/o neck pain, right upper back and shoulder pain since injuring it at work on Friday. States she was lifting a heavy garbage bag when she felt sudden onset of pain to those areas. Denies any falls or direct trauma to the area. States pain radiates down the arm and worse with movement.   Also states that she has a prior h/o neck surgery years ago.      Past Medical History:  Diagnosis Date  . Pseudotumor cerebri   . Vasovagal syncope     There are no active problems to display for this patient.   Past Surgical History:  Procedure Laterality Date  . CSF SHUNT      OB History   No obstetric history on file.      Home Medications    Prior to Admission medications   Medication Sig Start Date End Date Taking? Authorizing Provider  albuterol (PROVENTIL HFA;VENTOLIN HFA) 108 (90 Base) MCG/ACT inhaler Inhale 2 puffs into the lungs every 4 (four) hours as needed for wheezing. 07/05/16   Marylene Land, NP  cyclobenzaprine (FLEXERIL) 10 MG tablet Take 1 tablet (10 mg total) by mouth at bedtime. 09/29/18   Norval Gable, MD  HYDROcodone-acetaminophen (NORCO/VICODIN) 5-325 MG tablet 1-2 tabs po bid prn 09/29/18   Norval Gable, MD  meloxicam (MOBIC) 15 MG tablet Take 1 tablet (15 mg total) by mouth daily. 09/29/18   Norval Gable, MD  naproxen (NAPROSYN) 500 MG tablet Take 1 tablet (500 mg total) by mouth 2 (two) times daily with a meal. 11/01/17   Lorin Picket, PA-C  tiZANidine (ZANAFLEX) 4 MG capsule Take 1 capsule (4 mg total) by mouth 3 (three) times daily. 11/01/17   Lorin Picket, PA-C    Family History Family History  Problem Relation Age of Onset  . Other Mother   . Sickle cell anemia Father     Social History  Social History   Tobacco Use  . Smoking status: Current Every Day Smoker    Types: Cigarettes  . Smokeless tobacco: Never Used  Substance Use Topics  . Alcohol use: Yes    Comment: socially  . Drug use: No     Allergies   Doxycycline   Review of Systems Review of Systems   Physical Exam Triage Vital Signs ED Triage Vitals  Enc Vitals Group     BP 09/29/18 1425 131/82     Pulse Rate 09/29/18 1424 89     Resp 09/29/18 1424 18     Temp 09/29/18 1425 98.3 F (36.8 C)     Temp Source 09/29/18 1424 Oral     SpO2 09/29/18 1424 97 %     Weight 09/29/18 1429 233 lb (105.7 kg)     Height --      Head Circumference --      Peak Flow --      Pain Score 09/29/18 1429 8     Pain Loc --      Pain Edu? --      Excl. in [place]? --    No data found.  Updated Vital Signs BP 131/82 (BP Location: Right Arm)   Pulse 77   Temp 98.3  F (36.8 C) (Oral)   Resp 18   Wt 105.7 kg   LMP 09/15/2018 (Exact Date)   SpO2 99%   BMI 42.62 kg/m   Visual Acuity Right Eye Distance:   Left Eye Distance:   Bilateral Distance:    Right Eye Near:   Left Eye Near:    Bilateral Near:     Physical Exam Vitals signs and nursing note reviewed.  Constitutional:      General: She is not in acute distress.    Appearance: She is not toxic-appearing or diaphoretic.  Eyes:     Extraocular Movements: Extraocular movements intact.     Pupils: Pupils are equal, round, and reactive to light.  Neck:     Musculoskeletal: Normal range of motion and neck supple. Muscular tenderness present. No neck rigidity.  Musculoskeletal:     Right shoulder: She exhibits tenderness (over deltoid muscle). She exhibits normal range of motion, no bony tenderness, no swelling, no effusion, no crepitus, no deformity, no laceration, normal pulse and normal strength.     Cervical back: She exhibits tenderness, bony tenderness and spasm. She exhibits normal range of motion, no swelling, no edema, no deformity, no laceration  and normal pulse.     Comments: Positive supraspinatus isolation test right  Neurological:     Mental Status: She is alert.      UC Treatments / Results  Labs (all labs ordered are listed, but only abnormal results are displayed) Labs Reviewed - No data to display  EKG   Radiology Dg Cervical Spine Complete  Result Date: 09/29/2018 CLINICAL DATA:  Pain EXAM: CERVICAL SPINE - COMPLETE 4+ VIEW COMPARISON:  None. FINDINGS: The patient is status post prior ACDF from C5 through C7. Interbody spaces are noted at the C5-C6 and C6-C7 levels. The hardware appears grossly intact. There is straightening of the normal cervical lordotic curvature. There is mild disc height loss at the C4-C5 and C7-T1 levels. There is no prevertebral soft Tissue swelling. There is minimal osseous neural foraminal narrowing bilaterally. A catheter courses along the patient's right neck and is favored to represent a partially visualized VP shunt. IMPRESSION: No acute osseous abnormality. Electronically Signed   By: Constance Holster M.D.   On: 09/29/2018 15:22    Procedures Procedures (including critical care time)  Medications Ordered in UC Medications - No data to display  Initial Impression / Assessment and Plan / UC Course  I have reviewed the triage vital signs and the nursing notes.  Pertinent labs & imaging results that were available during my care of the patient were reviewed by me and considered in my medical decision making (see chart for details).      Final Clinical Impressions(s) / UC Diagnoses   Final diagnoses:  Neck pain  Upper back strain, initial encounter  Shoulder strain, right, initial encounter     Discharge Instructions     Rest, ice/heat,  anti-inflammatories Follow up at [facility] next week    ED Prescriptions    Medication Sig Dispense Auth. Provider   meloxicam (MOBIC) 15 MG tablet Take 1 tablet (15 mg total) by mouth daily. 30 tablet Norval Gable, MD   cyclobenzaprine (FLEXERIL) 10 MG tablet Take 1 tablet (10 mg total) by mouth at bedtime. 15 tablet Trissa Molina, Linward Foster, MD   HYDROcodone-acetaminophen (NORCO/VICODIN) 5-325 MG tablet 1-2 tabs po bid prn 6 tablet Norval Gable, MD     1. x-ray results and diagnosis reviewed with patient 2. rx as  per orders above; reviewed possible side effects, interactions, risks and benefits 3. Work restrictions as per work form  4. Recommend supportive treatment as above 5. Follow-up at [facility] next week   Controlled Substance Prescriptions [place] Controlled Substance Registry consulted? Yes, I have consulted the [place] Controlled Substances Registry for this patient, and feel the risk/benefit ratio today is favorable for proceeding with this prescription for a controlled substance.   Norval Gable, MD 09/29/18 [place], [place], MD 09/29/18 [phone]

## 2018-11-06 DIAGNOSIS — Z20828 Contact with and (suspected) exposure to other viral communicable diseases: Secondary | ICD-10-CM | POA: Diagnosis not present

## 2018-11-06 DIAGNOSIS — J449 Chronic obstructive pulmonary disease, unspecified: Secondary | ICD-10-CM | POA: Diagnosis not present

## 2018-11-06 DIAGNOSIS — R109 Unspecified abdominal pain: Secondary | ICD-10-CM | POA: Diagnosis not present

## 2018-11-06 DIAGNOSIS — R05 Cough: Secondary | ICD-10-CM | POA: Diagnosis not present

## 2018-11-06 DIAGNOSIS — F17200 Nicotine dependence, unspecified, uncomplicated: Secondary | ICD-10-CM | POA: Diagnosis not present

## 2018-11-06 DIAGNOSIS — R112 Nausea with vomiting, unspecified: Secondary | ICD-10-CM | POA: Diagnosis not present

## 2018-11-06 DIAGNOSIS — R197 Diarrhea, unspecified: Secondary | ICD-10-CM | POA: Diagnosis not present

## 2018-11-06 DIAGNOSIS — R5383 Other fatigue: Secondary | ICD-10-CM | POA: Diagnosis not present

## 2018-11-06 DIAGNOSIS — J029 Acute pharyngitis, unspecified: Secondary | ICD-10-CM | POA: Diagnosis not present

## 2018-11-06 DIAGNOSIS — R51 Headache: Secondary | ICD-10-CM | POA: Diagnosis not present

## 2018-11-06 DIAGNOSIS — R0989 Other specified symptoms and signs involving the circulatory and respiratory systems: Secondary | ICD-10-CM | POA: Diagnosis not present

## 2019-03-03 ENCOUNTER — Other Ambulatory Visit: Payer: Self-pay

## 2019-03-03 ENCOUNTER — Ambulatory Visit
Admission: EM | Admit: 2019-03-03 | Discharge: 2019-03-03 | Disposition: A | Discharge status: Home / Self Care | Payer: Medicare Other | Attending: Emergency Medicine | Admitting: Emergency Medicine

## 2019-03-03 ENCOUNTER — Encounter: Payer: Self-pay | Admitting: Emergency Medicine

## 2019-03-03 DIAGNOSIS — R109 Unspecified abdominal pain: Secondary | ICD-10-CM | POA: Diagnosis not present

## 2019-03-03 DIAGNOSIS — N760 Acute vaginitis: Secondary | ICD-10-CM | POA: Diagnosis not present

## 2019-03-03 DIAGNOSIS — R319 Hematuria, unspecified: Secondary | ICD-10-CM | POA: Diagnosis not present

## 2019-03-03 DIAGNOSIS — B9689 Other specified bacterial agents as the cause of diseases classified elsewhere: Secondary | ICD-10-CM | POA: Insufficient documentation

## 2019-03-03 DIAGNOSIS — N390 Urinary tract infection, site not specified: Secondary | ICD-10-CM

## 2019-03-03 LAB — URINALYSIS, COMPLETE (UACMP) WITH MICROSCOPIC
Bilirubin Urine: NEGATIVE
Glucose, UA: NEGATIVE mg/dL
Ketones, ur: NEGATIVE mg/dL
Nitrite: NEGATIVE
Protein, ur: 30 mg/dL — AB
Specific Gravity, Urine: 1.02 (ref 1.005–1.030)
pH: 8.5 — ABNORMAL HIGH (ref 5.0–8.0)

## 2019-03-03 LAB — WET PREP, GENITAL
Sperm: NONE SEEN
Trich, Wet Prep: NONE SEEN
Yeast Wet Prep HPF POC: NONE SEEN

## 2019-03-03 MED ORDER — METRONIDAZOLE 500 MG PO TABS: 500.0000 mg | ORAL_TABLET | Freq: Two times a day (BID) | ORAL | 0 refills | Status: DC

## 2019-03-03 MED ORDER — FLUCONAZOLE 150 MG PO TABS: 150.0000 mg | ORAL_TABLET | Freq: Every day | ORAL | 0 refills | Status: DC

## 2019-03-03 MED ORDER — CEPHALEXIN 500 MG PO CAPS: 500.0000 mg | ORAL_CAPSULE | Freq: Two times a day (BID) | ORAL | 0 refills | Status: AC

## 2019-03-03 NOTE — ED Provider Notes (Signed)
[facility] ____________________________________________  Time seen: Approximately 6:50 PM  I have reviewed the triage vital signs and the nursing notes.   HISTORY  Chief Complaint Abdominal Pain (LLQ)   HPI Kristi Li is a 34 y.o. female past medical history of endometriosis and ovarian cyst presenting for evaluation of 2 days of urinary frequency, urgency as well as some whitish vaginal discharge.  States that she used Monistat thinking it was a yeast infection without resolution of symptoms.  Patient reports she had left lower abdominal discomfort but states it is now midline and low.  Denies flank pain, vomiting, diarrhea, constipation, fevers.  Continues eat and drink well.  Denies concerns of STDs.  Denies pregnancy.  Denies recent fevers, cough or sickness.  Reports otherwise doing well.  Denies other alleviating measures or aggravating factors.  Shelbie Hutching, MD : PCP   Past Medical History:  Diagnosis Date  . Pseudotumor cerebri   . Vasovagal syncope     There are no problems to display for this patient.   Past Surgical History:  Procedure Laterality Date  . CSF SHUNT       No current facility-administered medications for this encounter.  Current Outpatient Medications:  .  albuterol (PROVENTIL HFA;VENTOLIN HFA) 108 (90 Base) MCG/ACT inhaler, Inhale 2 puffs into the lungs every 4 (four) hours as needed for wheezing., Disp: 1 Inhaler, Rfl: 0 .  cyclobenzaprine (FLEXERIL) 10 MG tablet, Take 1 tablet (10 mg total) by mouth at bedtime., Disp: 15 tablet, Rfl: 0 .  HYDROcodone-acetaminophen (NORCO/VICODIN) 5-325 MG tablet, 1-2 tabs po bid prn, Disp: 6 tablet, Rfl: 0 .  meloxicam (MOBIC) 15 MG tablet, Take 1 tablet (15 mg total) by mouth daily., Disp: 30 tablet, Rfl: 0 .  naproxen (NAPROSYN) 500 MG tablet, Take 1 tablet (500 mg total) by mouth 2 (two) times daily with a meal., Disp: 60 tablet, Rfl: 0 .  tiZANidine (ZANAFLEX) 4 MG capsule, Take 1  capsule (4 mg total) by mouth 3 (three) times daily., Disp: 21 capsule, Rfl: 0 .  cephALEXin (KEFLEX) 500 MG capsule, Take 1 capsule (500 mg total) by mouth 2 (two) times daily for 7 days., Disp: 14 capsule, Rfl: 0 .  fluconazole (DIFLUCAN) 150 MG tablet, Take 1 tablet (150 mg total) by mouth daily. Take one pill orally as needed at completion of antibiotic, Disp: 1 tablet, Rfl: 0 .  metroNIDAZOLE (FLAGYL) 500 MG tablet, Take 1 tablet (500 mg total) by mouth 2 (two) times daily., Disp: 14 tablet, Rfl: 0  Allergies Doxycycline  Family History  Problem Relation Age of Onset  . Other Mother   . Sickle cell anemia Father     Social History Social History   Tobacco Use  . Smoking status: Current Every Day Smoker    Types: Cigarettes  . Smokeless tobacco: Never Used  Substance Use Topics  . Alcohol use: Yes    Comment: socially  . Drug use: No    Review of Systems Constitutional: No fever ENT: No sore throat. Cardiovascular: Denies chest pain. Respiratory: Denies shortness of breath. Gastrointestinal: As above.  Genitourinary: positive for dysuria. Musculoskeletal: Negative for back pain. Skin: Negative for rash.   ____________________________________________   PHYSICAL EXAM:  VITAL SIGNS: ED Triage Vitals  Enc Vitals Group     BP 03/03/19 1647 123/76     Pulse Rate 03/03/19 1647 88     Resp 03/03/19 1647 18     Temp 03/03/19 1647 98.9 F (37.2 C)  Temp Source 03/03/19 1647 Oral     SpO2 03/03/19 1647 99 %     Weight 03/03/19 1643 235 lb (106.6 kg)     Height 03/03/19 1643 5\' 3"  (1.6 m)     Head Circumference --      Peak Flow --      Pain Score 03/03/19 1642 10     Pain Loc --      Pain Edu? --      Excl. in [place]? --     Constitutional: Alert and oriented. Well appearing and in no acute distress. Eyes: Conjunctivae are normal.  ENT      Head: Normocephalic and atraumatic. Cardiovascular: Normal rate, regular rhythm. Grossly normal heart sounds.  Good  peripheral circulation. Respiratory: Normal respiratory effort without tachypnea nor retractions. Breath sounds are clear and equal bilaterally. No wheezes, rales, rhonchi. Gastrointestinal: Normal Bowel sounds.  Midline suprapubic tenderness.  Abdomen otherwise soft and nontender.  No CVA tenderness. Musculoskeletal:  No flank tenderness.  Neurologic:  Normal speech and language. Speech is normal. No gait instability.  Skin:  Skin is warm, dry and intact. No rash noted. Psychiatric: Mood and affect are normal. Speech and behavior are normal. Patient exhibits appropriate insight and judgment   ___________________________________________   LABS (all labs ordered are listed, but only abnormal results are displayed)  Labs Reviewed  WET PREP, GENITAL - Abnormal; Notable for the following components:      Result Value   Clue Cells Wet Prep HPF POC PRESENT (*)    WBC, Wet Prep HPF POC FEW (*)    All other components within normal limits  URINALYSIS, COMPLETE (UACMP) WITH MICROSCOPIC - Abnormal; Notable for the following components:   APPearance CLOUDY (*)    pH 8.5 (*)    Hgb urine dipstick TRACE (*)    Protein, ur 30 (*)    Leukocytes,Ua LARGE (*)    Bacteria, UA MANY (*)    All other components within normal limits  URINE CULTURE     PROCEDURES Procedures   INITIAL IMPRESSION / ASSESSMENT AND PLAN / ED COURSE  Pertinent labs & imaging results that were available during my care of the patient were reviewed by me and considered in my medical decision making (see chart for details).  Well-appearing patient.  No acute distress.  Urinalysis reviewed, concern for UTI, will culture.  Patient elected for self wet prep.  Wet prep positive for clue cells.  Further discussed other differentials as well including abdominal or pelvic etiology including ovarian cyst.  Discussed with patient no CT or ultrasound availability at this facility at this time.  Patient elects to go forward with  treatment for bacterial vaginosis and urinary infection, and will treat with Keflex and Flagyl.  Diflucan prophylaxis given.  Counseled to proceed erectly to emergency room for any increase or worsening complaints.  Patient agreed to this plan.  Encouraged follow-up with OB/GYN.Discussed indication, risks and benefits of medications with patient.   Discussed follow up with Primary care physician this week. Discussed follow up and return parameters including no resolution or any worsening concerns. Patient verbalized understanding and agreed to plan.   ____________________________________________   FINAL CLINICAL IMPRESSION(S) / ED DIAGNOSES  Final diagnoses:  Abdominal pain, unspecified abdominal location  Bacterial vaginosis  Urinary tract infection with hematuria, site unspecified     ED Discharge Orders         Ordered    metroNIDAZOLE (FLAGYL) 500 MG tablet  2 times daily  03/03/19 1732    cephALEXin (KEFLEX) 500 MG capsule  2 times daily     03/03/19 1732    fluconazole (DIFLUCAN) 150 MG tablet  Daily     03/03/19 1732           Note: This dictation was prepared with Dragon dictation along with smaller phrase technology. Any transcriptional errors that result from this process are unintentional.          Marylene Land, NP 03/03/19 [phone]

## 2019-03-03 NOTE — ED Triage Notes (Addendum)
Pt c/o LLQ pain that radiates through to her left lower back. She states that it woke her up out of her sleep this morning. Denies fever,dysuria, diarrhea, nausea or vomiting. She states she has had a white vaginal discharge and used monistat last night before bed. She states that she has a cyst on one of her ovaries but is unsure which side.

## 2019-03-03 NOTE — Discharge Instructions (Addendum)
Take medication as prescribed. Rest. Drink plenty of fluids.   Follow up with your primary care physician this week as needed. Return to Urgent care as needed. Go directly to emergency room for any worsening pain.

## 2019-03-05 LAB — URINE CULTURE: Culture: >=100000 — AB

## 2019-03-06 ENCOUNTER — Telehealth (HOSPITAL_COMMUNITY): Payer: Self-pay | Admitting: Emergency Medicine

## 2019-03-06 MED ORDER — CIPROFLOXACIN HCL 500 MG PO TABS: 500.0000 mg | ORAL_TABLET | Freq: Two times a day (BID) | ORAL | 0 refills | Status: AC

## 2019-03-06 NOTE — Telephone Encounter (Signed)
Patient contacted by phone and made aware of urine   results and medicine. Pt verbalized understanding and had all questions answered.

## 2019-03-06 NOTE — Telephone Encounter (Signed)
Per Dr. Lanny Cramp, change to cipro BID x5 days. Attempted to reach pt, no answer, left vm to return call.

## 2019-06-30 DIAGNOSIS — Z79899 Other long term (current) drug therapy: Secondary | ICD-10-CM | POA: Diagnosis not present

## 2019-06-30 DIAGNOSIS — F329 Major depressive disorder, single episode, unspecified: Secondary | ICD-10-CM | POA: Diagnosis not present

## 2019-06-30 DIAGNOSIS — Z981 Arthrodesis status: Secondary | ICD-10-CM | POA: Diagnosis not present

## 2019-06-30 DIAGNOSIS — R569 Unspecified convulsions: Secondary | ICD-10-CM | POA: Diagnosis not present

## 2019-06-30 DIAGNOSIS — I272 Pulmonary hypertension, unspecified: Secondary | ICD-10-CM | POA: Diagnosis not present

## 2019-06-30 DIAGNOSIS — J45909 Unspecified asthma, uncomplicated: Secondary | ICD-10-CM | POA: Diagnosis not present

## 2019-06-30 DIAGNOSIS — Z982 Presence of cerebrospinal fluid drainage device: Secondary | ICD-10-CM | POA: Diagnosis not present

## 2019-06-30 DIAGNOSIS — M5412 Radiculopathy, cervical region: Secondary | ICD-10-CM | POA: Diagnosis not present

## 2019-07-11 DIAGNOSIS — M4804 Spinal stenosis, thoracic region: Secondary | ICD-10-CM | POA: Diagnosis not present

## 2019-07-11 DIAGNOSIS — M4722 Other spondylosis with radiculopathy, cervical region: Secondary | ICD-10-CM | POA: Diagnosis not present

## 2019-07-11 DIAGNOSIS — M5412 Radiculopathy, cervical region: Secondary | ICD-10-CM | POA: Diagnosis not present

## 2019-07-11 DIAGNOSIS — M4802 Spinal stenosis, cervical region: Secondary | ICD-10-CM | POA: Diagnosis not present

## 2019-07-21 DIAGNOSIS — G959 Disease of spinal cord, unspecified: Secondary | ICD-10-CM | POA: Diagnosis not present

## 2019-08-05 DIAGNOSIS — G959 Disease of spinal cord, unspecified: Secondary | ICD-10-CM | POA: Diagnosis not present

## 2019-08-12 DIAGNOSIS — G959 Disease of spinal cord, unspecified: Secondary | ICD-10-CM | POA: Diagnosis not present

## 2019-08-25 DIAGNOSIS — M5412 Radiculopathy, cervical region: Secondary | ICD-10-CM | POA: Diagnosis not present

## 2019-09-09 DIAGNOSIS — M48062 Spinal stenosis, lumbar region with neurogenic claudication: Secondary | ICD-10-CM | POA: Diagnosis not present

## 2019-11-20 DIAGNOSIS — M5412 Radiculopathy, cervical region: Secondary | ICD-10-CM | POA: Diagnosis not present

## 2019-11-20 DIAGNOSIS — E669 Obesity, unspecified: Secondary | ICD-10-CM | POA: Diagnosis not present

## 2019-11-20 DIAGNOSIS — G932 Benign intracranial hypertension: Secondary | ICD-10-CM | POA: Diagnosis not present

## 2019-11-20 DIAGNOSIS — M50121 Cervical disc disorder at C4-C5 level with radiculopathy: Secondary | ICD-10-CM | POA: Diagnosis not present

## 2019-11-20 DIAGNOSIS — Z6841 Body Mass Index (BMI) 40.0 and over, adult: Secondary | ICD-10-CM | POA: Diagnosis not present

## 2019-12-31 DIAGNOSIS — Z3490 Encounter for supervision of normal pregnancy, unspecified, unspecified trimester: Secondary | ICD-10-CM | POA: Diagnosis not present

## 2019-12-31 DIAGNOSIS — N939 Abnormal uterine and vaginal bleeding, unspecified: Secondary | ICD-10-CM | POA: Diagnosis not present

## 2019-12-31 DIAGNOSIS — N898 Other specified noninflammatory disorders of vagina: Secondary | ICD-10-CM | POA: Diagnosis not present

## 2020-01-02 DIAGNOSIS — M4802 Spinal stenosis, cervical region: Secondary | ICD-10-CM | POA: Diagnosis not present

## 2020-03-17 DIAGNOSIS — Z30013 Encounter for initial prescription of injectable contraceptive: Secondary | ICD-10-CM | POA: Diagnosis not present

## 2020-05-03 DIAGNOSIS — R109 Unspecified abdominal pain: Secondary | ICD-10-CM | POA: Diagnosis not present

## 2020-05-03 DIAGNOSIS — Z87891 Personal history of nicotine dependence: Secondary | ICD-10-CM | POA: Diagnosis not present

## 2020-05-03 DIAGNOSIS — J3489 Other specified disorders of nose and nasal sinuses: Secondary | ICD-10-CM | POA: Diagnosis not present

## 2020-05-03 DIAGNOSIS — G932 Benign intracranial hypertension: Secondary | ICD-10-CM | POA: Diagnosis not present

## 2020-05-03 DIAGNOSIS — Z982 Presence of cerebrospinal fluid drainage device: Secondary | ICD-10-CM | POA: Diagnosis not present

## 2020-05-03 DIAGNOSIS — Z791 Long term (current) use of non-steroidal anti-inflammatories (NSAID): Secondary | ICD-10-CM | POA: Diagnosis not present

## 2020-05-03 DIAGNOSIS — G9600 Cerebrospinal fluid leak, unspecified: Secondary | ICD-10-CM | POA: Diagnosis not present

## 2020-05-03 DIAGNOSIS — Z981 Arthrodesis status: Secondary | ICD-10-CM | POA: Diagnosis not present

## 2020-05-03 DIAGNOSIS — Z79899 Other long term (current) drug therapy: Secondary | ICD-10-CM | POA: Diagnosis not present

## 2020-06-09 DIAGNOSIS — Z309 Encounter for contraceptive management, unspecified: Secondary | ICD-10-CM | POA: Diagnosis not present

## 2020-06-09 DIAGNOSIS — Z113 Encounter for screening for infections with a predominantly sexual mode of transmission: Secondary | ICD-10-CM | POA: Diagnosis not present

## 2020-06-09 DIAGNOSIS — A6000 Herpesviral infection of urogenital system, unspecified: Secondary | ICD-10-CM | POA: Diagnosis not present

## 2020-06-22 DIAGNOSIS — H4710 Unspecified papilledema: Secondary | ICD-10-CM | POA: Diagnosis not present

## 2020-07-05 DIAGNOSIS — M4802 Spinal stenosis, cervical region: Secondary | ICD-10-CM | POA: Diagnosis not present

## 2020-07-05 DIAGNOSIS — Z6841 Body Mass Index (BMI) 40.0 and over, adult: Secondary | ICD-10-CM | POA: Diagnosis not present

## 2020-08-10 ENCOUNTER — Ambulatory Visit
Admission: EM | Admit: 2020-08-10 | Discharge: 2020-08-10 | Disposition: A | Discharge status: Home / Self Care | Payer: Medicare Other | Attending: Sports Medicine | Admitting: Sports Medicine

## 2020-08-10 ENCOUNTER — Other Ambulatory Visit: Payer: Self-pay

## 2020-08-10 ENCOUNTER — Ambulatory Visit: Payer: Self-pay

## 2020-08-10 DIAGNOSIS — M6530 Trigger finger, unspecified finger: Secondary | ICD-10-CM | POA: Diagnosis not present

## 2020-08-10 DIAGNOSIS — M25649 Stiffness of unspecified hand, not elsewhere classified: Secondary | ICD-10-CM

## 2020-08-10 DIAGNOSIS — M79644 Pain in right finger(s): Secondary | ICD-10-CM

## 2020-08-10 NOTE — Discharge Instructions (Addendum)
As we discussed, you have a trigger thumb.  You received a steroid injection.  Hopefully that helps her symptoms.  Unfortunately, I have no way to know if it will help you and if it does for how long. I have provided you the contact information of the only hand and wrist orthopedist in [facility].  Please call and make an appointment to see if there is any further care that can be provided. You can use over-the-counter anti-inflammatories as needed. I have provided educational handouts. If your symptoms worsen and you cannot get into orthopedics please see your primary care provider or go to the ER.

## 2020-08-10 NOTE — ED Triage Notes (Signed)
Patient complains of right hand pain x 1 month. States that she has not had any injury to area. States that most pain is in thumb and is worse in the morning.

## 2020-08-10 NOTE — ED Provider Notes (Signed)
[facility]    CSN: [id number] Arrival date & time: 08/10/20  1102      History   Chief Complaint Chief Complaint  Patient presents with   Hand Pain    right    HPI Kristi Li is a 35 y.o. female.   35 year old right-hand-dominant female who presents for evaluation of pain and clicking in her right thumb for about 4 to 6 weeks.  Her primary care needs are met at [facility] but she can get there today.  She works in [employer] at one of the [employer].  Regarding her right thumb issues she denies any accidents trauma or falls.  This is not a workers comp case.  She says that when she wakes up her thumb is stuck in position.  It starting to affect her ability to do activities of daily living.  No numbness tingling.  No neck pain.  None of the other fingers or thumb is involved.  She is never had this before.  No other issues or problems are offered.  No red flag signs or symptoms elicited on history.   Past Medical History:  Diagnosis Date   Pseudotumor cerebri    Vasovagal syncope     There are no problems to display for this patient.   Past Surgical History:  Procedure Laterality Date   CSF SHUNT      OB History   No obstetric history on file.      Home Medications    Prior to Admission medications   Medication Sig Start Date End Date Taking? Authorizing Provider  albuterol (PROVENTIL HFA;VENTOLIN HFA) 108 (90 Base) MCG/ACT inhaler Inhale 2 puffs into the lungs every 4 (four) hours as needed for wheezing. 07/05/16  Yes Marylene Land, NP  HYDROcodone-acetaminophen (NORCO/VICODIN) 5-325 MG tablet 1-2 tabs po bid prn 09/29/18  Yes Conty, Linward Foster, MD  meloxicam (MOBIC) 15 MG tablet Take 1 tablet (15 mg total) by mouth daily. 09/29/18  Yes Norval Gable, MD  topiramate (TOPAMAX) 25 MG tablet Take 25 mg by mouth 2 (two) times daily. 06/22/20  Yes [provider]  cyclobenzaprine (FLEXERIL) 10 MG tablet Take 1 tablet (10 mg total) by  mouth at bedtime. 09/29/18   Norval Gable, MD  fluconazole (DIFLUCAN) 150 MG tablet Take 1 tablet (150 mg total) by mouth daily. Take one pill orally as needed at completion of antibiotic 03/03/19   Marylene Land, NP  metroNIDAZOLE (FLAGYL) 500 MG tablet Take 1 tablet (500 mg total) by mouth 2 (two) times daily. 03/03/19   Marylene Land, NP  naproxen (NAPROSYN) 500 MG tablet Take 1 tablet (500 mg total) by mouth 2 (two) times daily with a meal. 11/01/17   Lorin Picket, PA-C  tiZANidine (ZANAFLEX) 4 MG capsule Take 1 capsule (4 mg total) by mouth 3 (three) times daily. 11/01/17   Lorin Picket, PA-C    Family History Family History  Problem Relation Age of Onset   Other Mother    Sickle cell anemia Father     Social History Social History   Tobacco Use   Smoking status: Every Day    Pack years: 0.00    Types: Cigarettes   Smokeless tobacco: Never  Vaping Use   Vaping Use: Never used  Substance Use Topics   Alcohol use: Yes    Comment: socially   Drug use: No     Allergies   Doxycycline   Review of Systems Review of Systems  Constitutional:  Positive for activity change. Negative for appetite change, chills, diaphoresis, fatigue and fever.  HENT:  Negative for congestion, ear pain, postnasal drip, rhinorrhea, sinus pressure, sinus pain, sneezing and sore throat.   Eyes:  Negative for pain.  Respiratory:  Negative for cough, chest tightness and shortness of breath.   Cardiovascular:  Negative for chest pain and palpitations.  Gastrointestinal:  Negative for abdominal pain, diarrhea, nausea and vomiting.  Genitourinary:  Negative for dysuria.  Musculoskeletal:  Positive for arthralgias. Negative for back pain, joint swelling, myalgias, neck pain and neck stiffness.  Skin:  Negative for color change, pallor, rash and wound.  Neurological:  Negative for dizziness, light-headedness, numbness and headaches.  All other systems reviewed and are negative.   Physical  Exam Triage Vital Signs ED Triage Vitals  Enc Vitals Group     BP 08/10/20 1117 121/87     Pulse Rate 08/10/20 1117 90     Resp 08/10/20 1117 18     Temp 08/10/20 1117 99.4 F (37.4 C)     Temp Source 08/10/20 1117 Oral     SpO2 08/10/20 1117 97 %     Weight 08/10/20 1115 248 lb (112.5 kg)     Height 08/10/20 1115 $RemoveBefor'5\' 3"'REyJYsczEaON$  (1.6 m)     Head Circumference --      Peak Flow --      Pain Score 08/10/20 1115 8     Pain Loc --      Pain Edu? --      Excl. in [place]? --    No data found.  Updated Vital Signs BP 121/87 (BP Location: Right Arm)   Pulse 90   Temp 99.4 F (37.4 C) (Oral)   Resp 18   Ht $R'5\' 3"'cs$  (1.6 m)   Wt 112.5 kg   SpO2 97%   BMI 43.93 kg/m   Visual Acuity Right Eye Distance:   Left Eye Distance:   Bilateral Distance:    Right Eye Near:   Left Eye Near:    Bilateral Near:     Physical Exam Vitals and nursing note reviewed.  Constitutional:      General: She is not in acute distress.    Appearance: Normal appearance. She is not ill-appearing or toxic-appearing.  HENT:     Head: Normocephalic and atraumatic.     Nose: Nose normal.     Mouth/Throat:     Mouth: Mucous membranes are moist.  Eyes:     Conjunctiva/sclera: Conjunctivae normal.     Pupils: Pupils are equal, round, and reactive to light.  Cardiovascular:     Rate and Rhythm: Normal rate and regular rhythm.     Pulses: Normal pulses.     Heart sounds: Normal heart sounds. No murmur heard.   No friction rub. No gallop.  Pulmonary:     Effort: Pulmonary effort is normal.     Breath sounds: Normal breath sounds. No stridor. No wheezing, rhonchi or rales.  Musculoskeletal:     Cervical back: Normal range of motion and neck supple.     Comments: Left hand including the thumb: Normal to inspection palpation range of motion special test.  Right hand including the thumb: No obvious bony abnormality ecchymosis erythema or soft tissue swelling.  She is tender to palpation at the palmar aspect of the  first MCP joint.  She does lock out and has a classic trigger thumb.  Flexor and extensor tendons are intact.  There is no ligamentous laxity.  She can  fully oppose.  It is quite painful when she does move the thumb and it does get stuck.  Neurovascular: Normal sensation 2+ pulses.  Skin:    General: Skin is warm and dry.     Capillary Refill: Capillary refill takes less than 2 seconds.     Coloration: Skin is not jaundiced.     Findings: No lesion or rash.  Neurological:     General: No focal deficit present.     Mental Status: She is alert and oriented to person, place, and time.     UC Treatments / Results  Labs (all labs ordered are listed, but only abnormal results are displayed) Labs Reviewed - No data to display  EKG   Radiology No results found.  Procedures Procedures (including critical care time)  Medications Ordered in UC Medications - No data to display  Initial Impression / Assessment and Plan / UC Course  I have reviewed the triage vital signs and the nursing notes.  Pertinent labs & imaging results that were available during my care of the patient were reviewed by me and considered in my medical decision making (see chart for details).  Clinical impression: Right thumb pain for about 4 to 6 weeks with an acquired trigger thumb and stiffness in the joint.  Remainder examination is reassuring.  Treatment plan: 1.  The findings and treatment plan were discussed in detail with the patient.  Patient was in agreement. 2.  I had a long discussion with her regarding her findings and her diagnosis.  I did offer her a steroid injection and she did want to proceed with that.  After the risks and benefits of the procedure explained in detail, the patient provided verbal consent.  The area was cleaned and draped in usual sterile fashion.  I injected half a cc of 1% lidocaine, and half cc of Kenalog 40 into the point of maximal tenderness at the palmar aspect of the first  MCP joint.  She tolerated this without incident.  After the procedure she felt her symptoms were much better and she was actually moving the joint much better. 3.  Educational handouts provided. 4.  I am going to refer her to [facility] and to the hand and wrist specialist with that group for continued care and further evaluation and management. 5.  She just use over-the-counter anti-inflammatories as needed. 6.  If symptoms worsen then she can go and to orthopedics then I advised her to contact her primary care provider or go to the emergency room. 7.  She was stable upon discharge and she will follow-up here as needed.   Final Clinical Impressions(s) / UC Diagnoses   Final diagnoses:  Trigger finger, acquired  Thumb pain, right  Stiffness of thumb joint     Discharge Instructions      As we discussed, you have a trigger thumb.  You received a steroid injection.  Hopefully that helps her symptoms.  Unfortunately, I have no way to know if it will help you and if it does for how long. I have provided you the contact information of the only hand and wrist orthopedist in [facility].  Please call and make an appointment to see if there is any further care that can be provided. You can use over-the-counter anti-inflammatories as needed. I have provided educational handouts. If your symptoms worsen and you cannot get into orthopedics please see your primary care provider or go to the ER.     ED Prescriptions  None    PDMP not reviewed this encounter.   Verda Cumins, MD 08/11/20 [phone]

## 2020-09-07 ENCOUNTER — Encounter: Payer: Self-pay | Admitting: Emergency Medicine

## 2020-09-07 ENCOUNTER — Other Ambulatory Visit: Payer: Self-pay

## 2020-09-07 ENCOUNTER — Ambulatory Visit
Admission: EM | Admit: 2020-09-07 | Discharge: 2020-09-07 | Disposition: A | Discharge status: Home / Self Care | Payer: Medicare Other | Attending: Emergency Medicine | Admitting: Emergency Medicine

## 2020-09-07 DIAGNOSIS — J020 Streptococcal pharyngitis: Secondary | ICD-10-CM

## 2020-09-07 LAB — GROUP A STREP BY PCR: Group A Strep by PCR: DETECTED — AB

## 2020-09-07 MED ORDER — LIDOCAINE VISCOUS HCL 2 % MT SOLN: 15.0000 mL | OROMUCOSAL | 0 refills | Status: DC | PRN

## 2020-09-07 MED ORDER — IBUPROFEN 100 MG/5ML PO SUSP
600.0000 mg | Freq: Once | ORAL | Status: AC
  Administered 2020-09-07: 600 mg via ORAL

## 2020-09-07 MED ORDER — ACETAMINOPHEN 325 MG PO TABS
650.0000 mg | ORAL_TABLET | Freq: Once | ORAL | Status: AC
  Administered 2020-09-07: 650 mg via ORAL

## 2020-09-07 MED ORDER — AMOXICILLIN-POT CLAVULANATE 400-57 MG/5ML PO SUSR: 875.0000 mg | Freq: Two times a day (BID) | ORAL | 0 refills | Status: AC

## 2020-09-07 NOTE — Discharge Instructions (Addendum)
Take the Augmentin twice daily for 7 days for treatment of your strep throat.  Gargle with warm salt water 2-3 times a day to soothe your throat, aid in pain relief, and aid in healing.  Take over-the-counter ibuprofen according to the package instructions as needed for pain.  You can use 15 mL (3 tablespoons) of viscous lidocaine 10 minutes before meals or medication ministration to help provide numbness to your throat and make it easier to swallow.  You can also use Chloraseptic or Sucrets lozenges, 1 lozenge every 2 hours as needed for throat pain.  If you develop any new or worsening symptoms return for reevaluation.

## 2020-09-07 NOTE — ED Provider Notes (Signed)
[facility]    CSN: [id number] Arrival date & time: 09/07/20  0813      History   Chief Complaint Chief Complaint  Patient presents with   Sore Throat    HPI Kristi Li is a 35 y.o. female.   HPI  35 year old female here for evaluation of sore throat.  Patient reports that she developed a severe sore throat yesterday.  She reports that she is having difficulty swallowing secondary to the pain.  She also feels like when she tries to swallow her saliva or fluids, or pain medication, that they are getting stuck in the back of her throat.  She has had associated symptoms of headache, body aches, and fatigue.  She denies fever, runny nose nasal congestion, cough, abdominal pain, nausea, or vomiting.  She is unaware of any sick contacts.  Past Medical History:  Diagnosis Date   Pseudotumor cerebri    Vasovagal syncope     There are no problems to display for this patient.   Past Surgical History:  Procedure Laterality Date   CSF SHUNT      OB History   No obstetric history on file.      Home Medications    Prior to Admission medications   Medication Sig Start Date End Date Taking? Authorizing Provider  amoxicillin-clavulanate (AUGMENTIN) 400-57 MG/5ML suspension Take 10.9 mLs (875 mg total) by mouth 2 (two) times daily for 7 days. 09/07/20 09/14/20 Yes Margarette Canada, NP  lidocaine (XYLOCAINE) 2 % solution Use as directed 15 mLs in the mouth or throat as needed for mouth pain. 09/07/20  Yes Margarette Canada, NP  albuterol (PROVENTIL HFA;VENTOLIN HFA) 108 (90 Base) MCG/ACT inhaler Inhale 2 puffs into the lungs every 4 (four) hours as needed for wheezing. 07/05/16   Marylene Land, NP  cyclobenzaprine (FLEXERIL) 10 MG tablet Take 1 tablet (10 mg total) by mouth at bedtime. 09/29/18   Norval Gable, MD  fluconazole (DIFLUCAN) 150 MG tablet Take 1 tablet (150 mg total) by mouth daily. Take one pill orally as needed at completion of antibiotic 03/03/19   Marylene Land, NP  HYDROcodone-acetaminophen (NORCO/VICODIN) 5-325 MG tablet 1-2 tabs po bid prn 09/29/18   Norval Gable, MD  meloxicam (MOBIC) 15 MG tablet Take 1 tablet (15 mg total) by mouth daily. 09/29/18   Norval Gable, MD  metroNIDAZOLE (FLAGYL) 500 MG tablet Take 1 tablet (500 mg total) by mouth 2 (two) times daily. 03/03/19   Marylene Land, NP  naproxen (NAPROSYN) 500 MG tablet Take 1 tablet (500 mg total) by mouth 2 (two) times daily with a meal. 11/01/17   Lorin Picket, PA-C  tiZANidine (ZANAFLEX) 4 MG capsule Take 1 capsule (4 mg total) by mouth 3 (three) times daily. 11/01/17   Lorin Picket, PA-C  topiramate (TOPAMAX) 25 MG tablet Take 25 mg by mouth 2 (two) times daily. 06/22/20   [provider]    Family History Family History  Problem Relation Age of Onset   Other Mother    Sickle cell anemia Father     Social History Social History   Tobacco Use   Smoking status: Every Day    Types: Cigarettes   Smokeless tobacco: Never  Vaping Use   Vaping Use: Never used  Substance Use Topics   Alcohol use: Yes    Comment: socially   Drug use: No     Allergies   Doxycycline   Review of Systems Review of Systems  Constitutional:  Positive  for fatigue. Negative for activity change, appetite change and fever.  HENT:  Positive for sore throat and trouble swallowing. Negative for congestion, ear pain and rhinorrhea.   Respiratory:  Negative for cough.   Gastrointestinal:  Negative for abdominal pain, diarrhea, nausea and vomiting.  Skin:  Negative for rash.  Neurological:  Positive for headaches.  Hematological:  Negative for adenopathy.  Psychiatric/Behavioral: Negative.      Physical Exam Triage Vital Signs ED Triage Vitals  Enc Vitals Group     BP 09/07/20 0836 138/81     Pulse Rate 09/07/20 0836 94     Resp 09/07/20 0836 16     Temp 09/07/20 0836 (!) 101.3 F (38.5 C)     Temp Source 09/07/20 0836 Oral     SpO2 09/07/20 0836 97 %     Weight --       Height --      Head Circumference --      Peak Flow --      Pain Score 09/07/20 0835 8     Pain Loc --      Pain Edu? --      Excl. in [place]? --    No data found.  Updated Vital Signs BP 138/81   Pulse 94   Temp (!) 101.3 F (38.5 C) (Oral)   Resp 16   SpO2 97%   Visual Acuity Right Eye Distance:   Left Eye Distance:   Bilateral Distance:    Right Eye Near:   Left Eye Near:    Bilateral Near:     Physical Exam Vitals and nursing note reviewed.  Constitutional:      General: She is not in acute distress.    Appearance: She is well-developed. She is obese. She is ill-appearing.  HENT:     Head: Normocephalic and atraumatic.     Right Ear: Tympanic membrane and ear canal normal. Tympanic membrane is not erythematous.     Left Ear: Tympanic membrane and ear canal normal. Tympanic membrane is not erythematous.     Mouth/Throat:     Mouth: Mucous membranes are moist.     Pharynx: Uvula midline. Posterior oropharyngeal erythema present.     Tonsils: Tonsillar exudate present. 2+ on the right. 2+ on the left.  Cardiovascular:     Rate and Rhythm: Normal rate and regular rhythm.     Heart sounds: Normal heart sounds. No murmur heard.   No gallop.  Pulmonary:     Effort: Pulmonary effort is normal.     Breath sounds: Normal breath sounds. No wheezing, rhonchi or rales.  Musculoskeletal:     Cervical back: Neck supple.  Lymphadenopathy:     Cervical: Cervical adenopathy present.  Skin:    General: Skin is warm and dry.     Capillary Refill: Capillary refill takes less than 2 seconds.     Findings: No erythema or rash.  Neurological:     General: No focal deficit present.     Mental Status: She is alert and oriented to person, place, and time.  Psychiatric:        Mood and Affect: Mood normal.        Behavior: Behavior normal.     UC Treatments / Results  Labs (all labs ordered are listed, but only abnormal results are displayed) Labs Reviewed  GROUP A STREP  BY PCR    EKG   Radiology No results found.  Procedures Procedures (including critical care time)  Medications Ordered  in UC Medications  acetaminophen (TYLENOL) tablet 650 mg (650 mg Oral Given 09/07/20 0852)  ibuprofen (ADVIL) 100 MG/5ML suspension 600 mg (600 mg Oral Given 09/07/20 0911)    Initial Impression / Assessment and Plan / UC Course  I have reviewed the triage vital signs and the nursing notes.  Pertinent labs & imaging results that were available during my care of the patient were reviewed by me and considered in my medical decision making (see chart for details).  Patient is a pleasant, though ill-appearing, 35 year old female here for evaluation of what she is turning a "severe sore throat".  She denies any upper respiratory symptoms or cough associated with this.  She states her symptoms started yesterday and she did not run a fever.  She does have a fever of 101.3 here in clinic.  Patient states that her biggest concern is that she feels like her saliva, fluids, or medication gets stuck in the back of her throat and chokes her.  Patient's physical exam reveals pearly gray tympanic membranes bilaterally with a normal light reflex and clear external auditory canals.  Oropharyngeal exam reveals 2+ edema to bilateral tonsillar pillars with the right being slightly larger than the left.  Neither tonsil passes midline and the uvula remains midline.  There is a white exudative matter on both tonsils.  Patient does have anterior cervical lymphadenopathy but no axillary lymphadenopathy appreciable on exam.  Cardiopulmonary exam reveals clear lung sounds in all fields.  Strep PCR collected at triage.  If patient strep is negative will check monoscreen.  Group A strep is positive.  We will treat patient for strep throat with Augmentin twice daily for 7 days.  Let patient use Tylenol and ibuprofen as needed for mild to moderate pain along with salt water gargles.  We will also provide  prescription for viscous lidocaine to help patient with throat discomfort as well.  Work note provided.   Final Clinical Impressions(s) / UC Diagnoses   Final diagnoses:  Strep throat     Discharge Instructions      Take the Augmentin twice daily for 7 days for treatment of your strep throat.  Gargle with warm salt water 2-3 times a day to soothe your throat, aid in pain relief, and aid in healing.  Take over-the-counter ibuprofen according to the package instructions as needed for pain.  You can use 15 mL (3 tablespoons) of viscous lidocaine 10 minutes before meals or medication ministration to help provide numbness to your throat and make it easier to swallow.  You can also use Chloraseptic or Sucrets lozenges, 1 lozenge every 2 hours as needed for throat pain.  If you develop any new or worsening symptoms return for reevaluation.      ED Prescriptions     Medication Sig Dispense Auth. Provider   amoxicillin-clavulanate (AUGMENTIN) 400-57 MG/5ML suspension Take 10.9 mLs (875 mg total) by mouth 2 (two) times daily for 7 days. 152.6 mL Margarette Canada, NP   lidocaine (XYLOCAINE) 2 % solution Use as directed 15 mLs in the mouth or throat as needed for mouth pain. 100 mL Margarette Canada, NP      PDMP not reviewed this encounter.   Margarette Canada, NP 09/07/20 [phone]

## 2020-09-07 NOTE — ED Triage Notes (Signed)
Severe sore throat - started yesterday. No known exposure

## 2020-09-08 ENCOUNTER — Other Ambulatory Visit: Payer: Self-pay

## 2020-09-08 ENCOUNTER — Ambulatory Visit
Admission: EM | Admit: 2020-09-08 | Discharge: 2020-09-08 | Discharge status: Against Medical Advice | Payer: Medicare Other

## 2020-12-30 ENCOUNTER — Other Ambulatory Visit: Payer: Self-pay

## 2020-12-30 ENCOUNTER — Ambulatory Visit
Admission: EM | Admit: 2020-12-30 | Discharge: 2020-12-30 | Disposition: A | Discharge status: Home / Self Care | Payer: Medicare Other | Attending: Internal Medicine | Admitting: Internal Medicine

## 2020-12-30 DIAGNOSIS — J101 Influenza due to other identified influenza virus with other respiratory manifestations: Secondary | ICD-10-CM | POA: Diagnosis not present

## 2020-12-30 DIAGNOSIS — B349 Viral infection, unspecified: Secondary | ICD-10-CM | POA: Diagnosis not present

## 2020-12-30 LAB — RAPID INFLUENZA A&B ANTIGENS
Influenza A (ARMC): POSITIVE — AB
Influenza B (ARMC): NEGATIVE

## 2020-12-30 MED ORDER — LIDOCAINE VISCOUS HCL 2 % MT SOLN: 15.0000 mL | OROMUCOSAL | 0 refills | Status: DC | PRN

## 2020-12-30 MED ORDER — OSELTAMIVIR PHOSPHATE 75 MG PO CAPS: 75.0000 mg | ORAL_CAPSULE | Freq: Two times a day (BID) | ORAL | 0 refills | Status: DC

## 2020-12-30 MED ORDER — IBUPROFEN 800 MG PO TABS: 800.0000 mg | ORAL_TABLET | Freq: Three times a day (TID) | ORAL | 0 refills | Status: DC

## 2020-12-30 MED ORDER — CYCLOBENZAPRINE HCL 10 MG PO TABS: 10.0000 mg | ORAL_TABLET | Freq: Every day | ORAL | 0 refills | Status: DC

## 2020-12-30 NOTE — Discharge Instructions (Addendum)
We will contact you if your flu test is positive.  Please quarantine while you wait for the results.  If your test is negative you may resume normal activities.  If your test is positive please continue to quarantine until you are without a fever for at least 24 hours after the medications.    You can take Tylenol and/or Ibuprofen as needed for fever reduction and pain relief.   For cough: honey 1/2 to 1 teaspoon (you can dilute the honey in water or another fluid).  You can also use guaifenesin and dextromethorphan for cough. You can use a humidifier for chest congestion and cough.  If you don't have a humidifier, you can sit in the bathroom with the hot shower running.      For sore throat: try warm salt water gargles, cepacol lozenges, throat spray, warm tea or water with lemon/honey, popsicles or ice, or OTC cold relief medicine for throat discomfort.   For congestion: take a daily anti-histamine like Zyrtec, Claritin, and a oral decongestant, such as pseudoephedrine.  You can also use Flonase 1-2 sprays in each nostril daily.   It is important to stay hydrated: drink plenty of fluids (water, gatorade/powerade/pedialyte, juices, or teas) to keep your throat moisturized and help further relieve irritation/discomfort.

## 2020-12-30 NOTE — ED Triage Notes (Signed)
Pt c/o cough, chills, body aches, sore throat fever. Sxs started yesterday.

## 2020-12-30 NOTE — ED Provider Notes (Addendum)
[facility]    CSN: [id number] Arrival date & time: 12/30/20  1059      History   Chief Complaint Chief Complaint  Patient presents with   Fever   Generalized Body Aches   Cough    HPI Kristi Li is a 35 y.o. female.   Patient presents with chills, fevers, nasal congestion, rhinorrhea, sore throat, nonproductive cough, intermittent generalized headaches and bilateral ear fullness and diarrhea for 1 day.  Has not attempted treatment.  Possible sick contact.  Denies shortness of breath, wheezing,, abdominal pain, vomiting, nausea.  Past Medical History:  Diagnosis Date   Pseudotumor cerebri    Vasovagal syncope     There are no problems to display for this patient.   Past Surgical History:  Procedure Laterality Date   CSF SHUNT      OB History   No obstetric history on file.      Home Medications    Prior to Admission medications   Medication Sig Start Date End Date Taking? Authorizing Provider  cyclobenzaprine (FLEXERIL) 10 MG tablet Take 1 tablet (10 mg total) by mouth at bedtime. 09/29/18  Yes Norval Gable, MD  meloxicam (MOBIC) 15 MG tablet Take 1 tablet (15 mg total) by mouth daily. 09/29/18  Yes Norval Gable, MD  naproxen (NAPROSYN) 500 MG tablet Take 1 tablet (500 mg total) by mouth 2 (two) times daily with a meal. 11/01/17  Yes Lorin Picket, PA-C  tiZANidine (ZANAFLEX) 4 MG capsule Take 1 capsule (4 mg total) by mouth 3 (three) times daily. 11/01/17  Yes Lorin Picket, PA-C  topiramate (TOPAMAX) 25 MG tablet Take 25 mg by mouth 2 (two) times daily. 06/22/20  Yes [provider]  albuterol (PROVENTIL HFA;VENTOLIN HFA) 108 (90 Base) MCG/ACT inhaler Inhale 2 puffs into the lungs every 4 (four) hours as needed for wheezing. 07/05/16   Marylene Land, NP  fluconazole (DIFLUCAN) 150 MG tablet Take 1 tablet (150 mg total) by mouth daily. Take one pill orally as needed at completion of antibiotic 03/03/19   Marylene Land, NP   HYDROcodone-acetaminophen (NORCO/VICODIN) 5-325 MG tablet 1-2 tabs po bid prn 09/29/18   Norval Gable, MD  lidocaine (XYLOCAINE) 2 % solution Use as directed 15 mLs in the mouth or throat as needed for mouth pain. 09/07/20   Margarette Canada, NP  metroNIDAZOLE (FLAGYL) 500 MG tablet Take 1 tablet (500 mg total) by mouth 2 (two) times daily. 03/03/19   Marylene Land, NP    Family History Family History  Problem Relation Age of Onset   Other Mother    Sickle cell anemia Father     Social History Social History   Tobacco Use   Smoking status: Every Day    Types: Cigarettes   Smokeless tobacco: Never  Vaping Use   Vaping Use: Never used  Substance Use Topics   Alcohol use: Yes    Comment: socially   Drug use: No     Allergies   Doxycycline   Review of Systems Review of Systems  Constitutional:  Positive for appetite change, chills and fever. Negative for activity change, diaphoresis, fatigue and unexpected weight change.  HENT:  Positive for congestion, ear pain, rhinorrhea and sore throat. Negative for dental problem, drooling, ear discharge, facial swelling, hearing loss, mouth sores, nosebleeds, sinus pressure, sinus pain, sneezing, tinnitus, trouble swallowing and voice change.   Respiratory:  Positive for cough. Negative for apnea, choking, chest tightness, shortness of breath, wheezing and stridor.  Cardiovascular: Negative.   Gastrointestinal:  Positive for diarrhea. Negative for abdominal distention, abdominal pain, anal bleeding, blood in stool, constipation, nausea, rectal pain and vomiting.  Skin: Negative.   Neurological:  Positive for headaches. Negative for dizziness, tremors, seizures, syncope, facial asymmetry, speech difficulty, weakness, light-headedness and numbness.    Physical Exam Triage Vital Signs ED Triage Vitals  Enc Vitals Group     BP 12/30/20 1157 (!) 108/94     Pulse Rate 12/30/20 1157 92     Resp 12/30/20 1157 16     Temp 12/30/20 1157 99.7  F (37.6 C)     Temp Source 12/30/20 1157 Oral     SpO2 12/30/20 1157 100 %     Weight 12/30/20 1155 250 lb (113.4 kg)     Height 12/30/20 1155 5\' 3"  (1.6 m)     Head Circumference --      Peak Flow --      Pain Score 12/30/20 1155 8     Pain Loc --      Pain Edu? --      Excl. in [place]? --    No data found.  Updated Vital Signs BP (!) 108/94 (BP Location: Left Arm)   Pulse 92   Temp 99.7 F (37.6 C) (Oral)   Resp 16   Ht 5\' 3"  (1.6 m)   Wt 250 lb (113.4 kg)   SpO2 100%   BMI 44.29 kg/m   Visual Acuity Right Eye Distance:   Left Eye Distance:   Bilateral Distance:    Right Eye Near:   Left Eye Near:    Bilateral Near:     Physical Exam Constitutional:      Appearance: Normal appearance. She is normal weight.  HENT:     Head: Normocephalic.     Right Ear: Tympanic membrane, ear canal and external ear normal.     Left Ear: Tympanic membrane, ear canal and external ear normal.     Nose: Congestion and rhinorrhea present.     Mouth/Throat:     Mouth: Mucous membranes are moist.     Pharynx: Posterior oropharyngeal erythema present.  Eyes:     Extraocular Movements: Extraocular movements intact.  Cardiovascular:     Rate and Rhythm: Normal rate and regular rhythm.     Pulses: Normal pulses.     Heart sounds: Normal heart sounds.  Pulmonary:     Effort: Pulmonary effort is normal.     Breath sounds: Normal breath sounds.  Musculoskeletal:     Cervical back: Normal range of motion and neck supple.  Lymphadenopathy:     Cervical: Cervical adenopathy present.  Skin:    General: Skin is warm and dry.  Neurological:     Mental Status: She is alert and oriented to person, place, and time. Mental status is at baseline.  Psychiatric:        Mood and Affect: Mood normal.        Behavior: Behavior normal.     UC Treatments / Results  Labs (all labs ordered are listed, but only abnormal results are displayed) Labs Reviewed  RAPID INFLUENZA A&B ANTIGENS     EKG   Radiology No results found.  Procedures Procedures (including critical care time)  Medications Ordered in UC Medications - No data to display  Initial Impression / Assessment and Plan / UC Course  I have reviewed the triage vital signs and the nursing notes.  Pertinent labs & imaging results that were available during  my care of the patient were reviewed by me and considered in my medical decision making (see chart for details).  Viral illness Influenza A   1.  Flu test positive discussed use of antivirals ,tamiflu 75 mg  bid for 5 days, notified of test results via telephone, 2 identifiers used  2.  Ibuprofen 800 mg 3 times daily as needed 3.  Flexeril 10 mg at bedtime as needed 4.  Lidocaine viscous 2% 15 mils every 4 hours as needed 5.  Over-the-counter medication for remaining symptom management 6.  Urgent care follow-up as needed 7.  Work note given Final Clinical Impressions(s) / UC Diagnoses   Final diagnoses:  None   Discharge Instructions   None    ED Prescriptions   None    PDMP not reviewed this encounter.   Hans Eden, NP 12/30/20 1226    Hans Eden, NP 12/30/20 1233    Hans Eden, [place] 12/30/20 [phone]

## 2021-03-22 ENCOUNTER — Other Ambulatory Visit: Payer: Self-pay

## 2021-03-22 ENCOUNTER — Ambulatory Visit
Admission: EM | Admit: 2021-03-22 | Discharge: 2021-03-22 | Disposition: A | Discharge status: Home / Self Care | Payer: Medicare Other | Attending: Emergency Medicine | Admitting: Emergency Medicine

## 2021-03-22 ENCOUNTER — Ambulatory Visit (INDEPENDENT_AMBULATORY_CARE_PROVIDER_SITE_OTHER): Payer: Medicare Other

## 2021-03-22 DIAGNOSIS — Z20822 Contact with and (suspected) exposure to covid-19: Secondary | ICD-10-CM | POA: Diagnosis not present

## 2021-03-22 DIAGNOSIS — J45901 Unspecified asthma with (acute) exacerbation: Secondary | ICD-10-CM | POA: Insufficient documentation

## 2021-03-22 DIAGNOSIS — J069 Acute upper respiratory infection, unspecified: Secondary | ICD-10-CM | POA: Insufficient documentation

## 2021-03-22 DIAGNOSIS — R059 Cough, unspecified: Secondary | ICD-10-CM

## 2021-03-22 LAB — RAPID INFLUENZA A&B ANTIGENS
Influenza A (ARMC): NEGATIVE
Influenza B (ARMC): NEGATIVE

## 2021-03-22 MED ORDER — ALBUTEROL SULFATE HFA 108 (90 BASE) MCG/ACT IN AERS: 1.0000 | INHALATION_SPRAY | RESPIRATORY_TRACT | 0 refills | Status: AC | PRN

## 2021-03-22 MED ORDER — PROMETHAZINE-DM 6.25-15 MG/5ML PO SYRP: 5.0000 mL | ORAL_SOLUTION | Freq: Four times a day (QID) | ORAL | 0 refills | Status: DC | PRN

## 2021-03-22 MED ORDER — FLUTICASONE PROPIONATE 50 MCG/ACT NA SUSP: 2.0000 | Freq: Every day | NASAL | 0 refills | Status: AC

## 2021-03-22 MED ORDER — IBUPROFEN 600 MG PO TABS: 600.0000 mg | ORAL_TABLET | Freq: Four times a day (QID) | ORAL | 0 refills | Status: DC | PRN

## 2021-03-22 MED ORDER — AEROCHAMBER PLUS MISC: 2 refills | Status: AC

## 2021-03-22 NOTE — ED Triage Notes (Signed)
Patient presents to Urgent Care with complaints of cough and chest congestion since Saturday. Treating symptoms with delsym, cold/flu meds, and cough drops. States she cannot get rid of cough.   Denies fever.

## 2021-03-22 NOTE — Discharge Instructions (Addendum)
2 puffs from your albuterol inhaler using your spacer before for 2 days, and then every 6 hours for 2 days, then as needed.  You may back off on the albuterol if you start to feel better sooner.  Regular Mucinex, Flonase, saline nasal irrigation with a NeilMed sinus rinse and distilled water as often as you want.  Promethazine DM for the cough.  1000 mg of Tylenol combined with 600 mg of ibuprofen together 3-4 times a day as needed for fevers, headaches.  I am sending you home with a wait-and-see prescription of prednisone.  Start it tomorrow if your COVID test is negative.  If your COVID test is positive, I will prescribe Molnupiravir.

## 2021-03-22 NOTE — ED Provider Notes (Addendum)
HPI  SUBJECTIVE:  Kristi Li is a 36 y.o. female who presents with 4 days of fevers Tmax 100.0, cough productive of green mucus, chest soreness secondary to cough.  She reports headaches, nasal congestion, greenish rhinorrhea, bilateral ear pain/fullness, sore throat, postnasal drip, wheezing primarily at night, shortness of breath.  No body aches, change in hearing, otorrhea, sinus pain or pressure, nausea, vomiting, diarrhea, abdominal pain.  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  She had 2 doses of the COVID-vaccine.  She got this years flu vaccine.  She had a negative home COVID test yesterday.  No antibiotics in the past 3 months.  No antipyretic in the past 6 hours.  She has tried Delsym, Tylenol, cough drops and DayQuil without improvement in her symptoms.  Symptoms are worse at night.  States that she is unable to sleep at night secondary to the cough. she had influenza in November 22, has a history of asthma, pseudotumor cerebri status post CSF shunt placement.  LMP: Nexplanon.  She is irregular.  Denies possibility being pregnant.  PMD: [facility]   Past Medical History:  Diagnosis Date   Pseudotumor cerebri    Vasovagal syncope     Past Surgical History:  Procedure Laterality Date   CSF SHUNT      Family History  Problem Relation Age of Onset   Other Mother    Sickle cell anemia Father     Social History   Tobacco Use   Smoking status: Former    Types: Cigarettes    Quit date: 03/23/2019    Years since quitting: 2.0   Smokeless tobacco: Never  Vaping Use   Vaping Use: Former   Start date: 02/19/2021  Substance Use Topics   Alcohol use: Yes    Comment: socially   Drug use: No    No current facility-administered medications for this encounter.  Current Outpatient Medications:    albuterol (VENTOLIN HFA) 108 (90 Base) MCG/ACT inhaler, Inhale 1-2 puffs into the lungs every 4 (four) hours as needed for wheezing or shortness of breath.,  Disp: 1 each, Rfl: 0   fluticasone (FLONASE) 50 MCG/ACT nasal spray, Place 2 sprays into both nostrils daily., Disp: 16 g, Rfl: 0   ibuprofen (ADVIL) 600 MG tablet, Take 1 tablet (600 mg total) by mouth every 6 (six) hours as needed., Disp: 30 tablet, Rfl: 0   promethazine-dextromethorphan (PROMETHAZINE-DM) 6.25-15 MG/5ML syrup, Take 5 mLs by mouth 4 (four) times daily as needed for cough., Disp: 118 mL, Rfl: 0   Spacer/Aero-Holding Chambers (AEROCHAMBER PLUS) inhaler, Use with inhaler, Disp: 1 each, Rfl: 2   topiramate (TOPAMAX) 25 MG tablet, Take 25 mg by mouth 2 (two) times daily., Disp: , Rfl:   Allergies  Allergen Reactions   Doxycycline Rash     ROS  As noted in HPI.   Physical Exam  BP 132/77 (BP Location: Left Arm)    Pulse 93    Temp 99.1 F (37.3 C) (Oral)    Resp 18    SpO2 99%   Constitutional: Well developed, well nourished, no acute distress Eyes:  EOMI, conjunctiva normal bilaterally HENT: Normocephalic, atraumatic,mucus membranes moist.  TMs normal bilaterally.  Positive nasal congestion.  Erythematous, swollen turbinates.  Mild maxillary sinus tenderness.  Normal tonsils without exudates, no obvious postnasal drip. Neck: No appreciable cervical lymphadenopathy Respiratory: Normal inspiratory effort, lungs clear bilaterally, poor air movement. Cardiovascular: Normal rate, regular rhythm, no murmurs, rubs, gallops. GI:  nondistended skin: No rash, skin intact Musculoskeletal: no deformities Neurologic: Alert & oriented x 3, no focal neuro deficits Psychiatric: Speech and behavior appropriate   ED Course   Medications - No data to display  Orders Placed This Encounter  Procedures   Rapid Influenza A&B Antigens    Standing Status:   Standing    Number of Occurrences:   1   SARS CORONAVIRUS 2 (TAT 6-24 HRS) Nasopharyngeal Nasopharyngeal Swab    Standing Status:   Standing    Number of Occurrences:   1   DG Chest 2 View    Standing Status:   Standing     Number of Occurrences:   1    Order Specific Question:   Reason for Exam (SYMPTOM  OR DIAGNOSIS REQUIRED)    Answer:   Cough, rule out pneumonia    Results for orders placed or performed during the hospital encounter of 03/22/21 (from the past 24 hour(s))  Rapid Influenza A&B Antigens     Status: None   Collection Time: 03/22/21  5:44 PM   Specimen: Respiratory  Result Value Ref Range   Influenza A ([facility]) NEGATIVE NEGATIVE   Influenza B ([facility]) NEGATIVE NEGATIVE  SARS CORONAVIRUS 2 (TAT 6-24 HRS) Nasopharyngeal Nasopharyngeal Swab     Status: None   Collection Time: 03/22/21  5:44 PM   Specimen: Nasopharyngeal Swab  Result Value Ref Range   SARS Coronavirus 2 NEGATIVE NEGATIVE   DG Chest 2 View  Result Date: 03/22/2021 CLINICAL DATA:  Cough concern for pneumonia EXAM: CHEST - 2 VIEW COMPARISON:  None. FINDINGS: The heart size and mediastinal contours are within normal limits. No focal consolidation. No pleural effusion. No pneumothorax. Cervical fusion hardware. Partially visualized shunt catheter tubing projects over the right hemithorax. IMPRESSION: No acute cardiopulmonary disease. Electronically Signed   By: Dahlia Bailiff M.D.   On: 03/22/2021 18:05    ED Clinical Impression  1. Viral URI with cough   2. Encounter for laboratory testing for COVID-19 virus   3. Asthma with acute exacerbation, unspecified asthma severity, unspecified whether persistent      ED Assessment/Plan  Checking rapid influenza, COVID PCR test chest x-ray because it would change management.  Patient is a candidate for Molnupiravir based on history of asthma.  In the meantime, regularly scheduled albuterol with a spacer, Flonase, Mucinex D, Tylenol/ibuprofen, Mucinex D, Promethazine DM, wait-and-see prescription of prednisone 40 mg for 5 days.  She is to fill it if her COVID is negative.  I suspect that she is having an asthma exacerbation with this viral respiratory illness.  Chest x-ray independently  reviewed.  No pneumonia.  See radiology report for details Rapid influenza negative. COVID PCR negative.  Presentation consistent with a viral respiratory illness and asthma exacerbation.  Plan as above.  Work note for 2 days.  Discussed labs, imaging, MDM, treatment plan, and plan for follow-up with patient. Discussed sn/sx that should prompt return to the ED. patient agrees with plan.   Meds ordered this encounter  Medications   albuterol (VENTOLIN HFA) 108 (90 Base) MCG/ACT inhaler    Sig: Inhale 1-2 puffs into the lungs every 4 (four) hours as needed for wheezing or shortness of breath.    Dispense:  1 each    Refill:  0   fluticasone (FLONASE) 50 MCG/ACT nasal spray    Sig: Place 2 sprays into both nostrils daily.    Dispense:  16 g    Refill:  0   Spacer/Aero-Holding  Chambers (AEROCHAMBER PLUS) inhaler    Sig: Use with inhaler    Dispense:  1 each    Refill:  2    Please educate patient on use   ibuprofen (ADVIL) 600 MG tablet    Sig: Take 1 tablet (600 mg total) by mouth every 6 (six) hours as needed.    Dispense:  30 tablet    Refill:  0   promethazine-dextromethorphan (PROMETHAZINE-DM) 6.25-15 MG/5ML syrup    Sig: Take 5 mLs by mouth 4 (four) times daily as needed for cough.    Dispense:  118 mL    Refill:  0      *This clinic note was created using [employer]. Therefore, there may be occasional mistakes despite careful proofreading.  ?    Melynda Ripple, MD 03/22/21 Ladoris Gene    Melynda Ripple, MD 03/23/21 [phone]

## 2021-03-23 ENCOUNTER — Ambulatory Visit: Payer: Self-pay

## 2021-03-23 LAB — SARS CORONAVIRUS 2 (TAT 6-24 HRS): SARS Coronavirus 2: NEGATIVE

## 2021-08-26 DIAGNOSIS — Z975 Presence of (intrauterine) contraceptive device: Secondary | ICD-10-CM | POA: Diagnosis not present

## 2021-08-26 DIAGNOSIS — Z30430 Encounter for insertion of intrauterine contraceptive device: Secondary | ICD-10-CM | POA: Diagnosis not present

## 2021-08-26 DIAGNOSIS — Z3046 Encounter for surveillance of implantable subdermal contraceptive: Secondary | ICD-10-CM | POA: Diagnosis not present

## 2021-08-26 DIAGNOSIS — Z6841 Body Mass Index (BMI) 40.0 and over, adult: Secondary | ICD-10-CM | POA: Diagnosis not present

## 2021-08-26 DIAGNOSIS — N939 Abnormal uterine and vaginal bleeding, unspecified: Secondary | ICD-10-CM | POA: Diagnosis not present

## 2021-08-26 DIAGNOSIS — Z881 Allergy status to other antibiotic agents status: Secondary | ICD-10-CM | POA: Diagnosis not present

## 2021-09-22 ENCOUNTER — Ambulatory Visit (INDEPENDENT_AMBULATORY_CARE_PROVIDER_SITE_OTHER): Payer: Medicare Other

## 2021-09-22 ENCOUNTER — Ambulatory Visit
Admission: EM | Admit: 2021-09-22 | Discharge: 2021-09-22 | Disposition: A | Discharge status: Home / Self Care | Payer: Medicare Other | Attending: Nurse Practitioner | Admitting: Nurse Practitioner

## 2021-09-22 DIAGNOSIS — M5442 Lumbago with sciatica, left side: Secondary | ICD-10-CM

## 2021-09-22 DIAGNOSIS — M545 Low back pain, unspecified: Secondary | ICD-10-CM | POA: Diagnosis not present

## 2021-09-22 MED ORDER — CYCLOBENZAPRINE HCL 5 MG PO TABS: 5.0000 mg | ORAL_TABLET | Freq: Three times a day (TID) | ORAL | 0 refills | Status: AC | PRN

## 2021-09-22 MED ORDER — KETOROLAC TROMETHAMINE 60 MG/2ML IM SOLN
60.0000 mg | Freq: Once | INTRAMUSCULAR | Status: AC
  Administered 2021-09-22: 60 mg via INTRAMUSCULAR

## 2021-09-22 MED ORDER — IBUPROFEN 800 MG PO TABS: 800.0000 mg | ORAL_TABLET | Freq: Three times a day (TID) | ORAL | 0 refills | Status: AC

## 2021-09-22 MED ORDER — METHYLPREDNISOLONE 4 MG PO TBPK: ORAL_TABLET | ORAL | 0 refills | Status: AC

## 2021-09-22 NOTE — ED Provider Notes (Signed)
[facility]    CSN: [id number] Arrival date & time: 09/22/21  0930      History   Chief Complaint Chief Complaint  Patient presents with   Back Pain    HPI Kristi Li is a 36 y.o. female.   HPI  Patient presents with complaint of back pain. This is a result of no known injury. She was bending forward at work on yesterday and was unable to stand. Onset of pain was 1 day ago and has been gradually worsening since. The pain is located in mid lower back, described as sharp and cramping and rated as 10 / 10, with radiation, to the left thigh. Symptoms include numbness. The patient also complains of no additional symptoms . The patient denies weakness, incontinence, dysuria, hematuria, abdominal pain, fever, tingling. The patient denies other injuries. Care prior to arrival consisted of acetaminophen with no relief.   Past Medical History:  Diagnosis Date   Pseudotumor cerebri    Vasovagal syncope     There are no problems to display for this patient.   Past Surgical History:  Procedure Laterality Date   CSF SHUNT      OB History   No obstetric history on file.      Home Medications    Prior to Admission medications   Medication Sig Start Date End Date Taking? Authorizing Provider  albuterol (VENTOLIN HFA) 108 (90 Base) MCG/ACT inhaler Inhale 1-2 puffs into the lungs every 4 (four) hours as needed for wheezing or shortness of breath. 03/22/21  Yes Melynda Ripple, MD  cyclobenzaprine (FLEXERIL) 5 MG tablet Take 1 tablet (5 mg total) by mouth 3 (three) times daily as needed for up to 7 days for muscle spasms. 09/22/21 09/29/21 Yes Silver Parkey, Diona Foley, NP  fluticasone (FLONASE) 50 MCG/ACT nasal spray Place 2 sprays into both nostrils daily. 03/22/21  Yes Melynda Ripple, MD  ibuprofen (ADVIL) 800 MG tablet Take 1 tablet (800 mg total) by mouth 3 (three) times daily. 09/22/21  Yes Vevelyn Francois, NP  methylPREDNISolone (MEDROL) 4 MG TBPK tablet Follow package  instructions. 09/22/21 09/28/21 Yes Vevelyn Francois, NP  Spacer/Aero-Holding Chambers (AEROCHAMBER PLUS) inhaler Use with inhaler 03/22/21  Yes Melynda Ripple, MD  topiramate (TOPAMAX) 25 MG tablet Take 25 mg by mouth 2 (two) times daily. 06/22/20  Yes [provider]    Family History Family History  Problem Relation Age of Onset   Other Mother    Sickle cell anemia Father     Social History Social History   Tobacco Use   Smoking status: Former    Types: Cigarettes    Quit date: 03/23/2019    Years since quitting: 2.5   Smokeless tobacco: Never  Vaping Use   Vaping Use: Former   Start date: 02/19/2021  Substance Use Topics   Alcohol use: Yes    Comment: socially   Drug use: No     Allergies   Doxycycline   Review of Systems Review of Systems   Physical Exam Triage Vital Signs ED Triage Vitals  Enc Vitals Group     BP --      Pulse --      Resp --      Temp --      Temp src --      SpO2 --      Weight 09/22/21 0946 245 lb (111.1 kg)     Height 09/22/21 0946 '5\' 3"'$  (1.6 m)     Head  Circumference --      Peak Flow --      Pain Score 09/22/21 0945 10     Pain Loc --      Pain Edu? --      Excl. in [place]? --    No data found.  Updated Vital Signs BP 102/88 (BP Location: Left Arm)   Pulse 75   Temp 98.8 F (37.1 C) (Oral)   Resp 18   Ht '5\' 3"'$  (1.6 m)   Wt 245 lb (111.1 kg)   LMP 09/08/2021   SpO2 100%   BMI 43.40 kg/m   Visual Acuity Right Eye Distance:   Left Eye Distance:   Bilateral Distance:    Right Eye Near:   Left Eye Near:    Bilateral Near:     Physical Exam Constitutional:      General: She is in acute distress.     Appearance: She is obese.  HENT:     Head: Normocephalic and atraumatic.  Cardiovascular:     Rate and Rhythm: Normal rate.  Pulmonary:     Effort: Pulmonary effort is normal.  Musculoskeletal:        General: Tenderness present.     Cervical back: Normal range of motion.     Comments: Unable to perform LR  at this time due to pain  Skin:    General: Skin is warm and dry.     Capillary Refill: Capillary refill takes 2 to 3 seconds.  Neurological:     Mental Status: She is alert and oriented to person, place, and time.  Psychiatric:        Mood and Affect: Mood normal.        Behavior: Behavior normal.      UC Treatments / Results  Labs (all labs ordered are listed, but only abnormal results are displayed) Labs Reviewed - No data to display  EKG   Radiology DG Lumbar Spine Complete  Result Date: 09/22/2021 CLINICAL DATA:  LOWER back pain. Heard a pop yesterday when bending. Pain for 2 days. Pain radiates down the LEFT leg. EXAM: LUMBAR SPINE - COMPLETE 4+ VIEW COMPARISON:  None Available. FINDINGS: There is no evidence of lumbar spine fracture. Alignment is normal. Intervertebral disc spaces are maintained. Partially imaged ventriculoperitoneal shunt overlies the RIGHT aspect of the abdomen. IMPRESSION: Negative. Electronically Signed   By: Nolon Nations M.D.   On: 09/22/2021 11:05    Procedures Procedures (including critical care time)  Medications Ordered in UC Medications  ketorolac (TORADOL) injection 60 mg (60 mg Intramuscular Given 09/22/21 1014)    Initial Impression / Assessment and Plan / UC Course  I have reviewed the triage vital signs and the nursing notes.  Pertinent labs & imaging results that were available during my care of the patient were reviewed by me and considered in my medical decision making (see chart for details).     Low back pain  Final Clinical Impressions(s) / UC Diagnoses   Final diagnoses:  Acute left-sided low back pain with left-sided sciatica     Discharge Instructions      Low Back Pain Xray was negative In the office you were given a Toradol injection this will help to bring down the inflammation You have been prescribed Medrol Dosepak  for ongoing pain relief IF symptoms persist after the medrol Motrin 800 mg  Flexeril 5 mg  at night but may take up to three times per day , you may not drive or  operate machinery while on this medication (sedation)  Rest, ice elevation and compression with brace or wrap is also effective for pain If your pain persist or gets worse you can contact physical therapy for additional treatment options You can also follow up with orthopedics       ED Prescriptions     Medication Sig Dispense Auth. Provider   methylPREDNISolone (MEDROL) 4 MG TBPK tablet Follow package instructions. 21 tablet Dionisio David M, NP   ibuprofen (ADVIL) 800 MG tablet Take 1 tablet (800 mg total) by mouth 3 (three) times daily. 21 tablet Dionisio David M, NP   cyclobenzaprine (FLEXERIL) 5 MG tablet Take 1 tablet (5 mg total) by mouth 3 (three) times daily as needed for up to 7 days for muscle spasms. 21 tablet Vevelyn Francois, NP      PDMP not reviewed this encounter.   Dionisio David [place], [place] 09/22/21 [phone]

## 2021-09-22 NOTE — Discharge Instructions (Addendum)
Low Back Pain Xray was negative In the office you were given a Toradol injection this will help to bring down the inflammation You have been prescribed Medrol Dosepak  for ongoing pain relief IF symptoms persist after the medrol Motrin 800 mg  Flexeril 5 mg at night but may take up to three times per day , you may not drive or operate machinery while on this medication (sedation)  Rest, ice elevation and compression with brace or wrap is also effective for pain If your pain persist or gets worse you can contact physical therapy for additional treatment options You can also follow up with orthopedics

## 2021-09-22 NOTE — ED Triage Notes (Signed)
Pt c/o back pain x2days.   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   Pt states that it is going down the left leg to the knee and starts right above the left hip.   Pt is with her daughter.   Pt had 2 herniated disks along the neck and pt states that it feels similar.

## 2021-11-17 DIAGNOSIS — Z6841 Body Mass Index (BMI) 40.0 and over, adult: Secondary | ICD-10-CM | POA: Diagnosis not present

## 2021-11-17 DIAGNOSIS — G9520 Unspecified cord compression: Secondary | ICD-10-CM | POA: Diagnosis not present

## 2022-01-04 ENCOUNTER — Ambulatory Visit: Payer: Self-pay

## 2022-03-19 ENCOUNTER — Ambulatory Visit
Admission: EM | Admit: 2022-03-19 | Discharge: 2022-03-19 | Disposition: A | Discharge status: Home / Self Care | Payer: Medicare Other

## 2022-03-19 DIAGNOSIS — M545 Low back pain, unspecified: Secondary | ICD-10-CM

## 2022-03-19 DIAGNOSIS — M542 Cervicalgia: Secondary | ICD-10-CM

## 2022-03-19 MED ORDER — CYCLOBENZAPRINE HCL 10 MG PO TABS: 10.0000 mg | ORAL_TABLET | Freq: Two times a day (BID) | ORAL | 0 refills | Status: AC | PRN

## 2022-03-19 MED ORDER — DEXAMETHASONE SODIUM PHOSPHATE 10 MG/ML IJ SOLN
10.0000 mg | Freq: Once | INTRAMUSCULAR | Status: AC
  Administered 2022-03-19: 10 mg via INTRAMUSCULAR

## 2022-03-19 MED ORDER — MELOXICAM 15 MG PO TABS: 15.0000 mg | ORAL_TABLET | Freq: Every day | ORAL | 0 refills | Status: AC

## 2022-03-19 MED ORDER — PREDNISONE 20 MG PO TABS: 40.0000 mg | ORAL_TABLET | Freq: Every day | ORAL | 0 refills | Status: AC

## 2022-03-19 NOTE — ED Provider Notes (Signed)
[facility]    CSN: [id number] Arrival date & time: 03/19/22  1224      History   Chief Complaint Chief Complaint  Patient presents with   Flank Pain    HPI Kristi Li is a 37 y.o. female.   Presents for evaluation of neck pain and left-sided low back pain present for 1 month.  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  Neck pain is described as a stiffness that has full range of motion, radiates down to the right arm with associated numbness, feels as if grip is weaker back pain does not radiate.  All pain is constant, worsening.  Has had 1-2 episodes of urinary incontinence, does not occur on a daily basis.  Has history of degenerative disc disease followed by the [facility] spine center.  Has attempted use of Tylenol and ibuprofen which has been ineffective.    Past Medical History:  Diagnosis Date   Pseudotumor cerebri    Vasovagal syncope     There are no problems to display for this patient.   Past Surgical History:  Procedure Laterality Date   CSF SHUNT      OB History   No obstetric history on file.      Home Medications    Prior to Admission medications   Medication Sig Start Date End Date Taking? Authorizing Provider  albuterol (VENTOLIN HFA) 108 (90 Base) MCG/ACT inhaler Inhale 1-2 puffs into the lungs every 4 (four) hours as needed for wheezing or shortness of breath. 03/22/21  Yes Melynda Ripple, MD  fluticasone (FLONASE) 50 MCG/ACT nasal spray Place 2 sprays into both nostrils daily. 03/22/21  Yes Melynda Ripple, MD  ibuprofen (ADVIL) 800 MG tablet Take 1 tablet (800 mg total) by mouth 3 (three) times daily. 09/22/21  Yes Vevelyn Francois, NP  levonorgestrel (MIRENA) 20 MCG/DAY IUD 1 each by Intrauterine route once.   Yes [provider]  Spacer/Aero-Holding Chambers (AEROCHAMBER PLUS) inhaler Use with inhaler 03/22/21  Yes Melynda Ripple, MD  topiramate (TOPAMAX) 25 MG tablet Take  25 mg by mouth 2 (two) times daily. 06/22/20  Yes [provider]    Family History Family History  Problem Relation Age of Onset   Other Mother    Sickle cell anemia Father     Social History Social History   Tobacco Use   Smoking status: Former    Types: Cigarettes    Quit date: 03/23/2019    Years since quitting: 2.9   Smokeless tobacco: Never  Vaping Use   Vaping Use: Former   Start date: 02/19/2021  Substance Use Topics   Alcohol use: Yes    Comment: socially   Drug use: No     Allergies   Doxycycline   Review of Systems Review of Systems  Respiratory: Negative.    Cardiovascular: Negative.   Genitourinary: Negative.   Musculoskeletal:  Positive for back pain and neck pain. Negative for arthralgias, gait problem, joint swelling, myalgias and neck stiffness.  Skin: Negative.      Physical Exam Triage Vital Signs ED Triage Vitals  Enc Vitals Group     BP 03/19/22 1242 (!) 147/83     Pulse Rate 03/19/22 1242 91     Resp 03/19/22 1242 18     Temp 03/19/22 1242 98.9 F (37.2 C)     Temp Source 03/19/22 1242 Oral     SpO2 03/19/22 1242 97 %  Weight 03/19/22 1240 250 lb (113.4 kg)     Height 03/19/22 1240 '5\' 3"'$  (1.6 m)     Head Circumference --      Peak Flow --      Pain Score 03/19/22 1238 10     Pain Loc --      Pain Edu? --      Excl. in [place]? --    No data found.  Updated Vital Signs BP (!) 147/83 (BP Location: Right Arm)   Pulse 91   Temp 98.9 F (37.2 C) (Oral)   Resp 18   Ht '5\' 3"'$  (1.6 m)   Wt 250 lb (113.4 kg)   SpO2 97%   BMI 44.29 kg/m   Visual Acuity Right Eye Distance:   Left Eye Distance:   Bilateral Distance:    Right Eye Near:   Left Eye Near:    Bilateral Near:     Physical Exam Constitutional:      Appearance: Normal appearance.  Eyes:     Extraocular Movements: Extraocular movements intact.  Neck:     Comments: Unable to reproduce tenderness on the neck, no ecchymosis, swelling or deformity, no rigidity,  able to complete full range of motion, no involvement of the thyroid Pulmonary:     Effort: Pulmonary effort is normal.  Musculoskeletal:     Comments: Tenderness is present along the left lower latissimus dorsi, no spinal tenderness noted, positive straight leg test, able to bear weight to the lower extremities, able to say her right, able to twist turn and bend  Neurological:     Mental Status: She is alert.      UC Treatments / Results  Labs (all labs ordered are listed, but only abnormal results are displayed) Labs Reviewed - No data to display  EKG   Radiology No results found.  Procedures Procedures (including critical care time)  Medications Ordered in UC Medications - No data to display  Initial Impression / Assessment and Plan / UC Course  I have reviewed the triage vital signs and the nursing notes.  Pertinent labs & imaging results that were available during my care of the patient were reviewed by me and considered in my medical decision making (see chart for details).  Neck pain, acute left-sided low back pain without sciatica  Etiology is most likely spinal related due to history, discussed this with patient, Decadron injection given in office, prescribed prednisone, meloxicam and Flexeril for outpatient use, discussed administration, recommended mended supportive measures through RICE, heat, massage, stretching and activity as tolerated, strongly advised follow-up appointment within 1 to 2 weeks.  Spinal specialist for further evaluation and management Final Clinical Impressions(s) / UC Diagnoses   Final diagnoses:  None   Discharge Instructions   None    ED Prescriptions   None    PDMP not reviewed this encounter.   Hans Eden, NP 03/19/22 1331

## 2022-03-19 NOTE — ED Triage Notes (Signed)
Pt states that she was seen 4 years ago and had a cervical spinal fusion from C4-C7 and then had a herniated disk above C4.

## 2022-03-19 NOTE — ED Triage Notes (Signed)
Pt c/o left side pain from neck to mid back, arm numbness,   Pt has Degenerative disk disease  Pt has tried OTC medication and it has not helped the pain.

## 2022-03-19 NOTE — Discharge Instructions (Signed)
Your pain is most likely caused by your spine and you will need reevaluation  You have been given an injection of steroids here today in your office to help reduce inflammation and irritation and ideally the symptoms will start to relax and 30 minutes  Starting tomorrow take prednisone every morning with food for 5 days, after completion take meloxicam for 5 days then you may use medicine as needed, may take Tylenol in addition to this  May use muscle relaxant twice daily as needed for additional comfort  You may use heating pad in 15 minute intervals as needed for additional comfort, within the first 2-3 days you may find comfort in using ice in 10-15 minutes over affected area  Begin stretching affected area daily for 10 minutes as tolerated to further loosen muscles   When lying down place pillow underneath and between knees for support  Can try sleeping without pillow on firm mattress   Practice good posture: head back, shoulders back, chest forward, pelvis back and weight distributed evenly on both legs  Schedule follow-up appointment with your spine specialist within 1 to 2 weeks for reevaluation

## 2022-05-10 DIAGNOSIS — Z6841 Body Mass Index (BMI) 40.0 and over, adult: Secondary | ICD-10-CM | POA: Diagnosis not present

## 2022-05-10 DIAGNOSIS — Z111 Encounter for screening for respiratory tuberculosis: Secondary | ICD-10-CM | POA: Diagnosis not present

## 2022-05-10 DIAGNOSIS — R519 Headache, unspecified: Secondary | ICD-10-CM | POA: Diagnosis not present

## 2022-05-10 DIAGNOSIS — E559 Vitamin D deficiency, unspecified: Secondary | ICD-10-CM | POA: Diagnosis not present

## 2022-06-21 DIAGNOSIS — R0601 Orthopnea: Secondary | ICD-10-CM | POA: Diagnosis not present

## 2022-06-21 DIAGNOSIS — R0683 Snoring: Secondary | ICD-10-CM | POA: Diagnosis not present

## 2022-06-21 DIAGNOSIS — E559 Vitamin D deficiency, unspecified: Secondary | ICD-10-CM | POA: Diagnosis not present

## 2022-06-21 DIAGNOSIS — R799 Abnormal finding of blood chemistry, unspecified: Secondary | ICD-10-CM | POA: Diagnosis not present

## 2022-06-21 DIAGNOSIS — R002 Palpitations: Secondary | ICD-10-CM | POA: Diagnosis not present

## 2022-06-21 DIAGNOSIS — E611 Iron deficiency: Secondary | ICD-10-CM | POA: Diagnosis not present

## 2022-06-21 DIAGNOSIS — R5383 Other fatigue: Secondary | ICD-10-CM | POA: Diagnosis not present

## 2022-07-03 DIAGNOSIS — I081 Rheumatic disorders of both mitral and tricuspid valves: Secondary | ICD-10-CM | POA: Diagnosis not present

## 2022-07-03 DIAGNOSIS — R0601 Orthopnea: Secondary | ICD-10-CM | POA: Diagnosis not present

## 2022-07-03 DIAGNOSIS — R002 Palpitations: Secondary | ICD-10-CM | POA: Diagnosis not present

## 2022-07-11 DIAGNOSIS — R002 Palpitations: Secondary | ICD-10-CM | POA: Diagnosis not present

## 2022-07-12 DIAGNOSIS — R002 Palpitations: Secondary | ICD-10-CM | POA: Diagnosis not present

## 2022-11-02 IMAGING — CR DG CHEST 2V
2 series · 2 of 2 positions shown · non-contrast
Comparison: None.

CLINICAL DATA: Cough concern for pneumonia

EXAM:
CHEST - 2 VIEW

[chest pa]
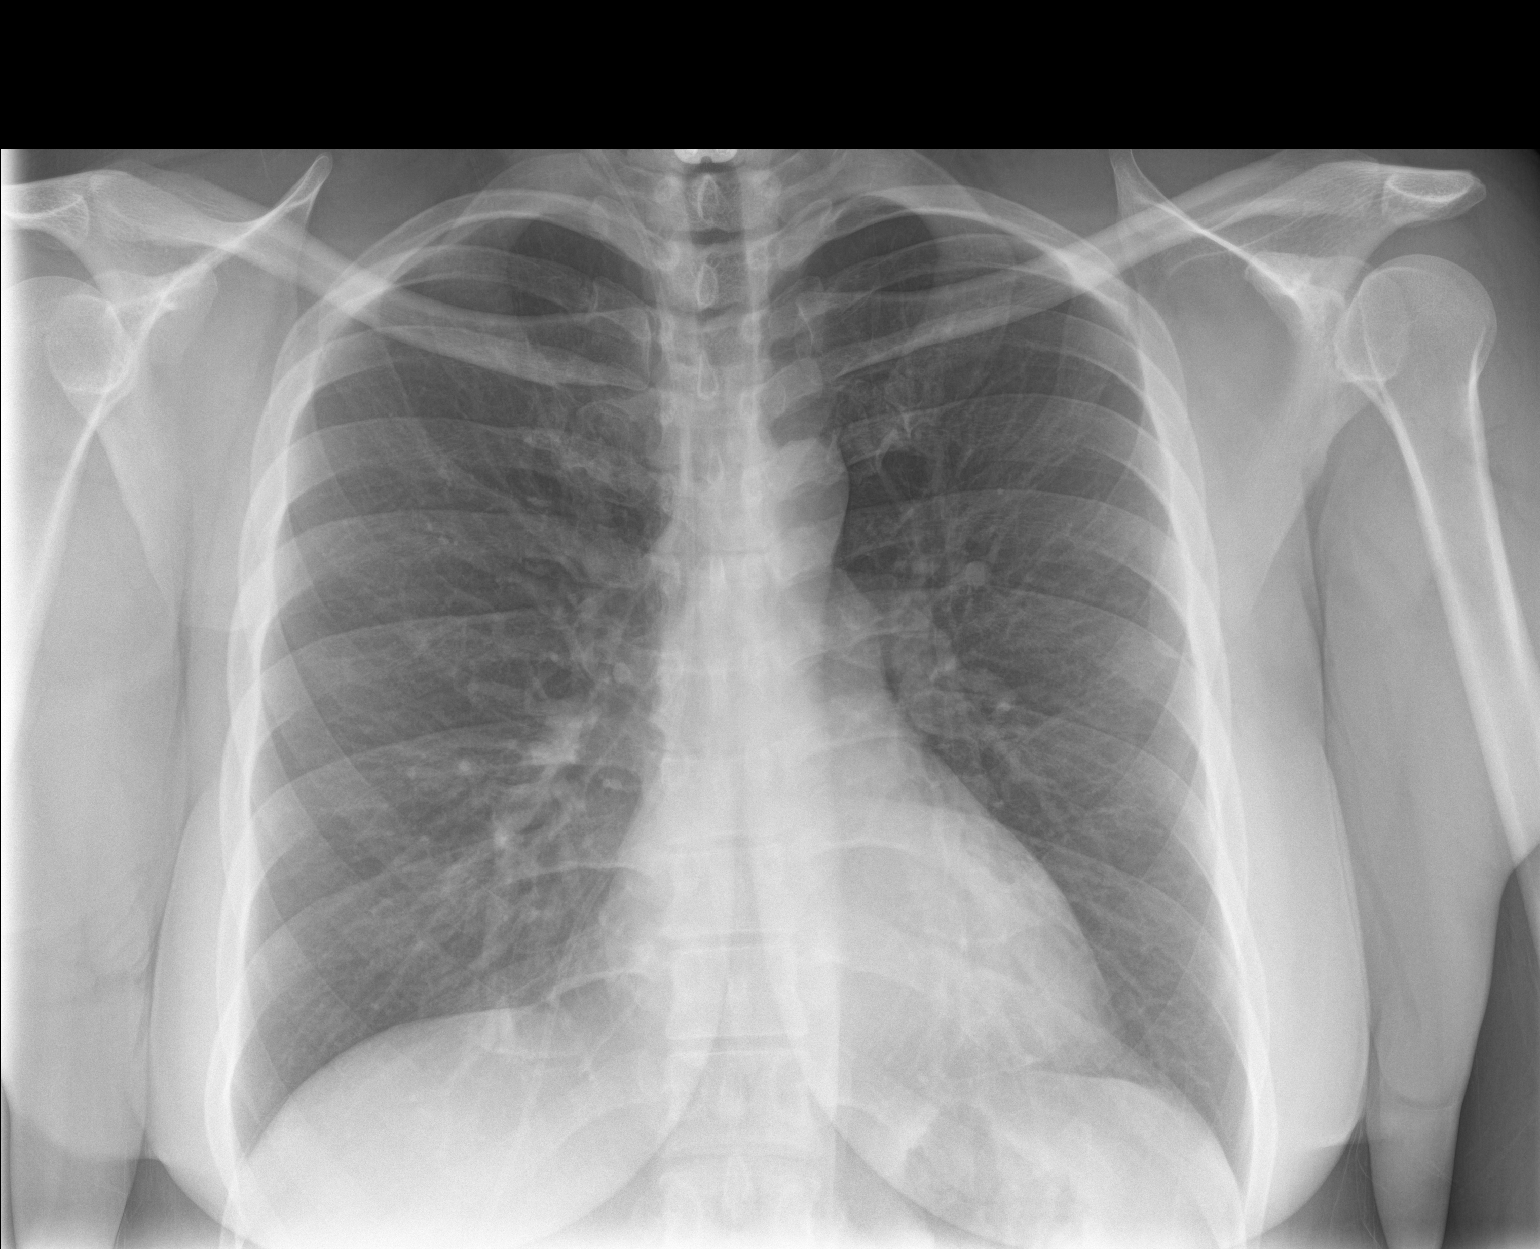

[chest lat]
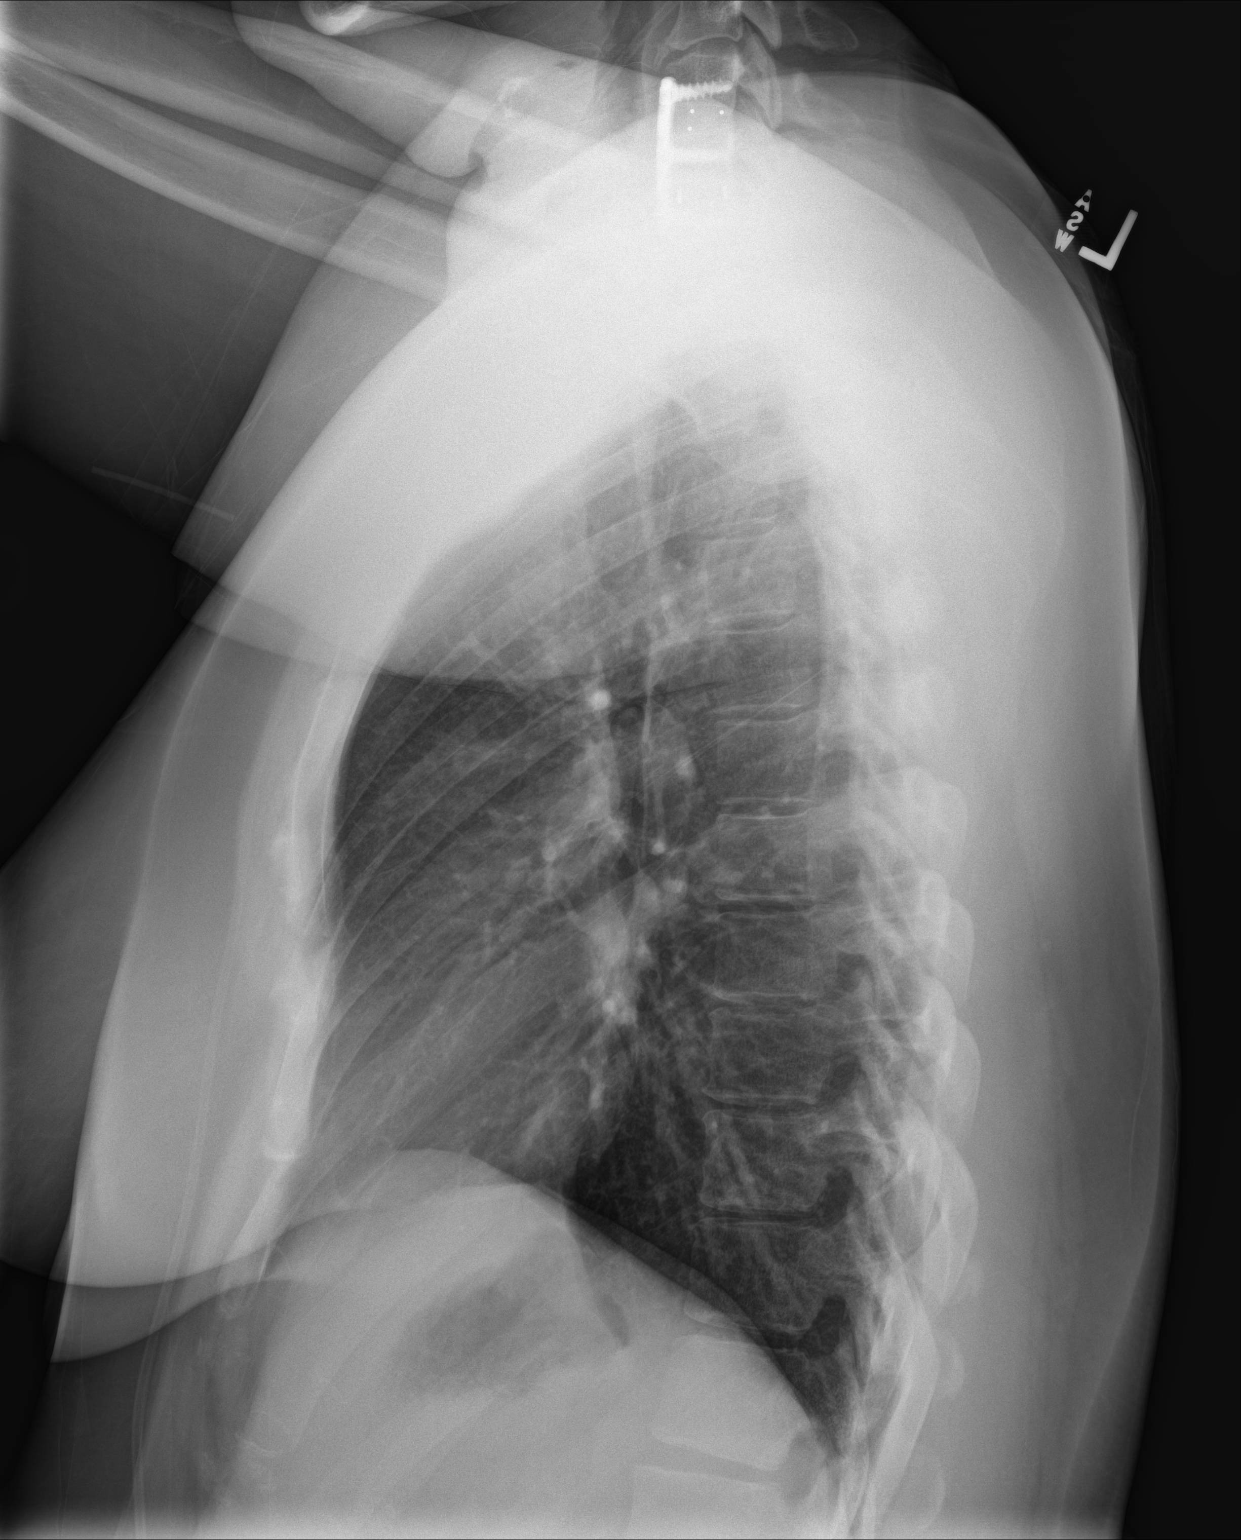

[2 of 2 positions shown; findings below may reference images not displayed]

FINDINGS: The heart size and mediastinal contours are within normal limits. No
focal consolidation. No pleural effusion. No pneumothorax. Cervical
fusion hardware. Partially visualized shunt catheter tubing projects
over the right hemithorax.
IMPRESSION: No acute cardiopulmonary disease.

## 2022-11-03 DIAGNOSIS — M542 Cervicalgia: Secondary | ICD-10-CM | POA: Diagnosis not present

## 2022-11-03 DIAGNOSIS — M48062 Spinal stenosis, lumbar region with neurogenic claudication: Secondary | ICD-10-CM | POA: Diagnosis not present

## 2022-11-03 DIAGNOSIS — M4802 Spinal stenosis, cervical region: Secondary | ICD-10-CM | POA: Diagnosis not present

## 2022-11-21 DIAGNOSIS — M5126 Other intervertebral disc displacement, lumbar region: Secondary | ICD-10-CM | POA: Diagnosis not present

## 2022-11-21 DIAGNOSIS — M48061 Spinal stenosis, lumbar region without neurogenic claudication: Secondary | ICD-10-CM | POA: Diagnosis not present

## 2022-11-21 DIAGNOSIS — M47812 Spondylosis without myelopathy or radiculopathy, cervical region: Secondary | ICD-10-CM | POA: Diagnosis not present

## 2022-11-21 DIAGNOSIS — M48062 Spinal stenosis, lumbar region with neurogenic claudication: Secondary | ICD-10-CM | POA: Diagnosis not present

## 2022-11-21 DIAGNOSIS — M4802 Spinal stenosis, cervical region: Secondary | ICD-10-CM | POA: Diagnosis not present

## 2022-11-21 DIAGNOSIS — M47816 Spondylosis without myelopathy or radiculopathy, lumbar region: Secondary | ICD-10-CM | POA: Diagnosis not present

## 2022-12-01 DIAGNOSIS — M542 Cervicalgia: Secondary | ICD-10-CM | POA: Diagnosis not present

## 2022-12-01 DIAGNOSIS — M545 Low back pain, unspecified: Secondary | ICD-10-CM | POA: Diagnosis not present

## 2023-03-12 DIAGNOSIS — R5383 Other fatigue: Secondary | ICD-10-CM | POA: Diagnosis not present

## 2023-03-12 DIAGNOSIS — R0683 Snoring: Secondary | ICD-10-CM | POA: Diagnosis not present

## 2023-03-12 DIAGNOSIS — G4733 Obstructive sleep apnea (adult) (pediatric): Secondary | ICD-10-CM | POA: Diagnosis not present

## 2023-05-24 DIAGNOSIS — M722 Plantar fascial fibromatosis: Secondary | ICD-10-CM | POA: Diagnosis not present

## 2023-05-24 DIAGNOSIS — R109 Unspecified abdominal pain: Secondary | ICD-10-CM | POA: Diagnosis not present

## 2023-05-28 DIAGNOSIS — Z981 Arthrodesis status: Secondary | ICD-10-CM | POA: Diagnosis not present

## 2023-05-28 DIAGNOSIS — R1012 Left upper quadrant pain: Secondary | ICD-10-CM | POA: Diagnosis not present

## 2023-05-28 DIAGNOSIS — I272 Pulmonary hypertension, unspecified: Secondary | ICD-10-CM | POA: Diagnosis not present

## 2023-05-28 DIAGNOSIS — I10 Essential (primary) hypertension: Secondary | ICD-10-CM | POA: Diagnosis not present

## 2023-05-28 DIAGNOSIS — R109 Unspecified abdominal pain: Secondary | ICD-10-CM | POA: Diagnosis not present

## 2023-05-28 DIAGNOSIS — G932 Benign intracranial hypertension: Secondary | ICD-10-CM | POA: Diagnosis not present

## 2023-05-28 DIAGNOSIS — M546 Pain in thoracic spine: Secondary | ICD-10-CM | POA: Diagnosis not present

## 2023-05-28 DIAGNOSIS — R1032 Left lower quadrant pain: Secondary | ICD-10-CM | POA: Diagnosis not present

## 2023-05-28 DIAGNOSIS — J45909 Unspecified asthma, uncomplicated: Secondary | ICD-10-CM | POA: Diagnosis not present

## 2023-06-11 DIAGNOSIS — N3001 Acute cystitis with hematuria: Secondary | ICD-10-CM | POA: Diagnosis not present

## 2023-06-11 DIAGNOSIS — K921 Melena: Secondary | ICD-10-CM | POA: Diagnosis not present

## 2023-06-11 DIAGNOSIS — R7309 Other abnormal glucose: Secondary | ICD-10-CM | POA: Diagnosis not present

## 2023-06-11 DIAGNOSIS — E559 Vitamin D deficiency, unspecified: Secondary | ICD-10-CM | POA: Diagnosis not present

## 2023-06-11 DIAGNOSIS — Z13220 Encounter for screening for lipoid disorders: Secondary | ICD-10-CM | POA: Diagnosis not present

## 2023-06-11 DIAGNOSIS — G9520 Unspecified cord compression: Secondary | ICD-10-CM | POA: Diagnosis not present

## 2023-06-11 DIAGNOSIS — R001 Bradycardia, unspecified: Secondary | ICD-10-CM | POA: Diagnosis not present

## 2023-06-11 DIAGNOSIS — E66813 Obesity, class 3: Secondary | ICD-10-CM | POA: Diagnosis not present

## 2023-06-11 DIAGNOSIS — R5383 Other fatigue: Secondary | ICD-10-CM | POA: Diagnosis not present

## 2023-06-11 DIAGNOSIS — R1012 Left upper quadrant pain: Secondary | ICD-10-CM | POA: Diagnosis not present

## 2023-06-11 DIAGNOSIS — I10 Essential (primary) hypertension: Secondary | ICD-10-CM | POA: Diagnosis not present

## 2023-06-11 DIAGNOSIS — Z23 Encounter for immunization: Secondary | ICD-10-CM | POA: Diagnosis not present

## 2023-06-11 DIAGNOSIS — Z982 Presence of cerebrospinal fluid drainage device: Secondary | ICD-10-CM | POA: Diagnosis not present

## 2023-06-11 DIAGNOSIS — Z111 Encounter for screening for respiratory tuberculosis: Secondary | ICD-10-CM | POA: Diagnosis not present

## 2023-06-11 DIAGNOSIS — Z800 Family history of malignant neoplasm of digestive organs: Secondary | ICD-10-CM | POA: Diagnosis not present

## 2023-06-11 DIAGNOSIS — G4733 Obstructive sleep apnea (adult) (pediatric): Secondary | ICD-10-CM | POA: Diagnosis not present

## 2023-06-11 DIAGNOSIS — G932 Benign intracranial hypertension: Secondary | ICD-10-CM | POA: Diagnosis not present

## 2023-06-13 DIAGNOSIS — G4733 Obstructive sleep apnea (adult) (pediatric): Secondary | ICD-10-CM | POA: Diagnosis not present

## 2023-06-13 DIAGNOSIS — R7989 Other specified abnormal findings of blood chemistry: Secondary | ICD-10-CM | POA: Diagnosis not present

## 2023-06-13 DIAGNOSIS — R002 Palpitations: Secondary | ICD-10-CM | POA: Diagnosis not present

## 2023-06-13 DIAGNOSIS — R600 Localized edema: Secondary | ICD-10-CM | POA: Diagnosis not present

## 2023-06-13 DIAGNOSIS — R030 Elevated blood-pressure reading, without diagnosis of hypertension: Secondary | ICD-10-CM | POA: Diagnosis not present

## 2023-06-13 DIAGNOSIS — R001 Bradycardia, unspecified: Secondary | ICD-10-CM | POA: Diagnosis not present

## 2023-06-13 DIAGNOSIS — Z7189 Other specified counseling: Secondary | ICD-10-CM | POA: Diagnosis not present

## 2023-07-19 DIAGNOSIS — R1012 Left upper quadrant pain: Secondary | ICD-10-CM | POA: Diagnosis not present

## 2023-07-19 DIAGNOSIS — K921 Melena: Secondary | ICD-10-CM | POA: Diagnosis not present

## 2023-07-19 DIAGNOSIS — K529 Noninfective gastroenteritis and colitis, unspecified: Secondary | ICD-10-CM | POA: Diagnosis not present

## 2023-07-19 DIAGNOSIS — Z800 Family history of malignant neoplasm of digestive organs: Secondary | ICD-10-CM | POA: Diagnosis not present

## 2023-08-04 DIAGNOSIS — J45909 Unspecified asthma, uncomplicated: Secondary | ICD-10-CM | POA: Diagnosis not present

## 2023-08-04 DIAGNOSIS — G4733 Obstructive sleep apnea (adult) (pediatric): Secondary | ICD-10-CM | POA: Diagnosis not present

## 2023-08-04 DIAGNOSIS — I10 Essential (primary) hypertension: Secondary | ICD-10-CM | POA: Diagnosis not present

## 2023-08-04 DIAGNOSIS — R079 Chest pain, unspecified: Secondary | ICD-10-CM | POA: Diagnosis not present

## 2023-08-04 DIAGNOSIS — R002 Palpitations: Secondary | ICD-10-CM | POA: Diagnosis not present

## 2023-08-04 DIAGNOSIS — Z87891 Personal history of nicotine dependence: Secondary | ICD-10-CM | POA: Diagnosis not present

## 2023-08-04 DIAGNOSIS — Z888 Allergy status to other drugs, medicaments and biological substances status: Secondary | ICD-10-CM | POA: Diagnosis not present

## 2023-08-05 DIAGNOSIS — R002 Palpitations: Secondary | ICD-10-CM | POA: Diagnosis not present

## 2023-08-10 DIAGNOSIS — G4733 Obstructive sleep apnea (adult) (pediatric): Secondary | ICD-10-CM | POA: Diagnosis not present

## 2023-08-10 DIAGNOSIS — R002 Palpitations: Secondary | ICD-10-CM | POA: Diagnosis not present

## 2023-08-10 DIAGNOSIS — I491 Atrial premature depolarization: Secondary | ICD-10-CM | POA: Diagnosis not present

## 2023-08-10 DIAGNOSIS — Z7189 Other specified counseling: Secondary | ICD-10-CM | POA: Diagnosis not present

## 2023-08-10 DIAGNOSIS — R001 Bradycardia, unspecified: Secondary | ICD-10-CM | POA: Diagnosis not present

## 2023-08-28 DIAGNOSIS — R002 Palpitations: Secondary | ICD-10-CM | POA: Diagnosis not present

## 2023-08-28 DIAGNOSIS — Z6841 Body Mass Index (BMI) 40.0 and over, adult: Secondary | ICD-10-CM | POA: Diagnosis not present

## 2023-08-28 DIAGNOSIS — N939 Abnormal uterine and vaginal bleeding, unspecified: Secondary | ICD-10-CM | POA: Diagnosis not present

## 2023-08-28 DIAGNOSIS — Z1231 Encounter for screening mammogram for malignant neoplasm of breast: Secondary | ICD-10-CM | POA: Diagnosis not present

## 2023-08-28 DIAGNOSIS — I10 Essential (primary) hypertension: Secondary | ICD-10-CM | POA: Diagnosis not present

## 2023-08-28 DIAGNOSIS — G4733 Obstructive sleep apnea (adult) (pediatric): Secondary | ICD-10-CM | POA: Diagnosis not present

## 2023-08-28 DIAGNOSIS — B379 Candidiasis, unspecified: Secondary | ICD-10-CM | POA: Diagnosis not present

## 2023-08-28 DIAGNOSIS — G932 Benign intracranial hypertension: Secondary | ICD-10-CM | POA: Diagnosis not present

## 2023-09-05 DIAGNOSIS — T8339XA Other mechanical complication of intrauterine contraceptive device, initial encounter: Secondary | ICD-10-CM | POA: Diagnosis not present

## 2023-09-05 DIAGNOSIS — N920 Excessive and frequent menstruation with regular cycle: Secondary | ICD-10-CM | POA: Diagnosis not present

## 2023-10-11 DIAGNOSIS — R059 Cough, unspecified: Secondary | ICD-10-CM | POA: Diagnosis not present

## 2023-10-11 DIAGNOSIS — U071 COVID-19: Secondary | ICD-10-CM | POA: Diagnosis not present

## 2023-10-11 DIAGNOSIS — R519 Headache, unspecified: Secondary | ICD-10-CM | POA: Diagnosis not present

## 2023-10-11 DIAGNOSIS — J029 Acute pharyngitis, unspecified: Secondary | ICD-10-CM | POA: Diagnosis not present

## 2023-10-11 DIAGNOSIS — R030 Elevated blood-pressure reading, without diagnosis of hypertension: Secondary | ICD-10-CM | POA: Diagnosis not present

## 2023-10-11 DIAGNOSIS — R509 Fever, unspecified: Secondary | ICD-10-CM | POA: Diagnosis not present

## 2023-10-16 ENCOUNTER — Encounter: Payer: Self-pay | Admitting: Emergency Medicine

## 2023-10-16 ENCOUNTER — Ambulatory Visit: Admission: EM | Admit: 2023-10-16 | Discharge: 2023-10-16 | Disposition: A | Discharge status: Home / Self Care

## 2023-10-16 DIAGNOSIS — R197 Diarrhea, unspecified: Secondary | ICD-10-CM | POA: Diagnosis not present

## 2023-10-16 DIAGNOSIS — R110 Nausea: Secondary | ICD-10-CM

## 2023-10-16 DIAGNOSIS — U071 COVID-19: Secondary | ICD-10-CM

## 2023-10-16 MED ORDER — ONDANSETRON HCL 4 MG PO TABS: 4.0000 mg | ORAL_TABLET | Freq: Four times a day (QID) | ORAL | 0 refills | Status: AC

## 2023-10-16 NOTE — ED Triage Notes (Signed)
 Patient states that she's had sx since Thursday. Patient states that she tested positive for Covid Thursday. Patient states that she feels like she's getting worse. She's developed nausea and diarrhea started Sunday.,

## 2023-10-16 NOTE — Discharge Instructions (Addendum)
 CDC guidelines state that you must wear a mask for the first 5 days of symptoms when you are around other people.  After 5 days you no longer need to mask as you are no longer considered infectious.  There is no longer need to quarantine unless you have a fever.  If you do have a fever then you need to quarantine until you have been fever free for 24 hours without taking Tylenol  and/or ibuprofen .  Use over-the-counter Tylenol  and/or ibuprofen  according to the package instructions as needed for fever and pain.  Use the 4 mg Zofran  tablets every 6 hours as needed for nausea.  Follow the list of food choices in your discharge packet to help alleviate your diarrhea.  As long as you are able to replace the fluid you are losing through your stool by mouth I would avoid using Imodium.  If your diarrhea stools increase in frequency and you feel you are unable to replace the fluid by mouth that you are losing through your bowels you may use over-the-counter Imodium.  Follow the package instructions for directions on dosing and frequency.  If you develop any sharp abdominal pain, increase in diarrhea stools or you are unable to control them with Imodium or replace the fluid by mouth, or develop blood in your stool you need to go to the ER for evaluation.  If you develop any worsening respiratory symptoms such as shortness of breath, shortness of breath at rest, feel as though you cannot catch your breath, you are unable to speak in full sentences, or, as a late sign, your lips begin turning blue you need to call 911 and go to the ER for evaluation.

## 2023-10-16 NOTE — ED Provider Notes (Signed)
 [facility]    CSN: [id number] Arrival date & time: 10/16/23  0805      History   Chief Complaint Chief Complaint  Patient presents with   Nausea    HPI Kristi Li is a 38 y.o. female.   HPI  38 year old female with past medical history significant for pseudotumor cerebri and vasovagal syncope presents for evaluation of worsening respiratory and GI symptoms in the setting of a positive COVID test.  She tested positive for COVID 5 days ago and is experiencing runny nose, nasal congestion, sore throat, and ear pain.  2 days ago she developed nausea and diarrhea.  She reports that she feels like her symptoms are becoming worse.  At the onset she did have a fever with a Tmax of 101 but that resolved 4 days ago.  Past Medical History:  Diagnosis Date   Pseudotumor cerebri    Vasovagal syncope     There are no active problems to display for this patient.   Past Surgical History:  Procedure Laterality Date   CSF SHUNT      OB History   No obstetric history on file.      Home Medications    Prior to Admission medications   Medication Sig Start Date End Date Taking? Authorizing Provider  carvedilol (COREG) 6.25 MG tablet Take 6.25 mg by mouth 2 (two) times daily.   Yes [provider]  levonorgestrel  (MIRENA ) 20 MCG/DAY IUD 1 each by Intrauterine route once.   Yes [provider]  ondansetron  (ZOFRAN ) 4 MG tablet Take 1 tablet (4 mg total) by mouth every 6 (six) hours. 10/16/23  Yes Bernardino Ditch, NP  albuterol  (VENTOLIN  HFA) 108 (90 Base) MCG/ACT inhaler Inhale 1-2 puffs into the lungs every 4 (four) hours as needed for wheezing or shortness of breath. 03/22/21   Van Knee, MD  cyclobenzaprine  (FLEXERIL ) 10 MG tablet Take 1 tablet (10 mg total) by mouth 2 (two) times daily as needed for muscle spasms. 03/19/22   White, Shelba SAUNDERS, NP  fluticasone  (FLONASE ) 50 MCG/ACT nasal spray Place 2 sprays into both nostrils daily. 03/22/21   Van Knee, MD  ibuprofen  (ADVIL ) 800 MG tablet Take 1 tablet (800 mg total) by mouth 3 (three) times daily. 09/22/21   Myrna Camelia HERO, NP  meloxicam  (MOBIC ) 15 MG tablet Take 1 tablet (15 mg total) by mouth daily. 03/19/22   White, Shelba SAUNDERS, NP  predniSONE  (DELTASONE ) 20 MG tablet Take 2 tablets (40 mg total) by mouth daily. 03/19/22   Teresa Shelba SAUNDERS, NP  Spacer/Aero-Holding Chambers (AEROCHAMBER PLUS) inhaler Use with inhaler 03/22/21   Van Knee, MD  topiramate (TOPAMAX) 25 MG tablet Take 25 mg by mouth 2 (two) times daily. 06/22/20   [provider]    Family History Family History  Problem Relation Age of Onset   Other Mother    Sickle cell anemia Father     Social History Social History   Tobacco Use   Smoking status: Former    Current packs/day: 0.00    Types: Cigarettes    Quit date: 03/23/2019    Years since quitting: 4.5   Smokeless tobacco: Never  Vaping Use   Vaping status: Former   Start date: 02/19/2021  Substance Use Topics   Alcohol use: Yes    Comment: socially   Drug use: No     Allergies   Doxycycline   Review of Systems Review of Systems  Constitutional:  Positive for fever.  HENT:  Positive for congestion, ear pain, rhinorrhea and sore throat.   Respiratory:  Positive for cough. Negative for shortness of breath and wheezing.   Gastrointestinal:  Positive for diarrhea and nausea. Negative for vomiting.  Skin:  Negative for rash.     Physical Exam Triage Vital Signs ED Triage Vitals  Encounter Vitals Group     BP      Girls Systolic BP Percentile      Girls Diastolic BP Percentile      Boys Systolic BP Percentile      Boys Diastolic BP Percentile      Pulse      Resp      Temp      Temp src      SpO2      Weight      Height      Head Circumference      Peak Flow      Pain Score      Pain Loc      Pain Education      Exclude from Growth Chart    No data found.  Updated Vital Signs BP (!) 134/98 (BP Location: Right  Arm)   Pulse (!) 59   Temp 98.8 F (37.1 C) (Oral)   Resp 17   Wt 248 lb (112.5 kg)   SpO2 96%   BMI 43.93 kg/m   Visual Acuity Right Eye Distance:   Left Eye Distance:   Bilateral Distance:    Right Eye Near:   Left Eye Near:    Bilateral Near:     Physical Exam Vitals and nursing note reviewed.  Constitutional:      Appearance: Normal appearance. She is not ill-appearing.  HENT:     Head: Normocephalic and atraumatic.     Right Ear: Tympanic membrane, ear canal and external ear normal. There is no impacted cerumen.     Left Ear: Tympanic membrane, ear canal and external ear normal. There is no impacted cerumen.     Nose: Congestion and rhinorrhea present.     Comments: Patient mucosa is edematous and erythematous with scant clear discharge in both naris.    Mouth/Throat:     Mouth: Mucous membranes are moist.     Pharynx: Oropharynx is clear. No oropharyngeal exudate or posterior oropharyngeal erythema.  Cardiovascular:     Rate and Rhythm: Normal rate and regular rhythm.     Pulses: Normal pulses.     Heart sounds: Normal heart sounds. No murmur heard.    No friction rub. No gallop.  Pulmonary:     Effort: Pulmonary effort is normal.     Breath sounds: Normal breath sounds. No wheezing, rhonchi or rales.  Musculoskeletal:     Cervical back: Normal range of motion and neck supple. No tenderness.  Lymphadenopathy:     Cervical: No cervical adenopathy.  Skin:    General: Skin is warm and dry.     Capillary Refill: Capillary refill takes less than 2 seconds.     Findings: No erythema or rash.  Neurological:     General: No focal deficit present.     Mental Status: She is alert and oriented to person, place, and time.      UC Treatments / Results  Labs (all labs ordered are listed, but only abnormal results are displayed) Labs Reviewed - No data to display  EKG   Radiology No results found.  Procedures Procedures (including critical care  time)  Medications Ordered in  UC Medications - No data to display  Initial Impression / Assessment and Plan / UC Course  I have reviewed the triage vital signs and the nursing notes.  Pertinent labs & imaging results that were available during my care of the patient were reviewed by me and considered in my medical decision making (see chart for details).   Patient is a pleasant 38 year old female presenting for worsening respiratory and GI symptoms outlined HPI above.  Her most significant complaint at this time is bilateral ear pain, nausea, and diarrhea.  Her physical exam does reveal inflammation of her upper respiratory tract though her tympanic membrane's are pearly gray.  Normal light reflex.  Her throat is also unremarkable.  I suspect that the patient is experiencing the symptoms as a result of inflammation from the COVID virus.  There is no evidence of secondary otitis media.  I have advised her to continue using over-the-counter Tylenol  and/or ibuprofen  for pain I will prescribe Zofran  that she can use as needed for the nausea.  As long as she is able to replace fluids by mouth she should avoid using Imodium for the diarrhea.  However, if her diarrhea stools increase she may use Imodium to help reduce the frequency and prevent dehydration.  If she develops any abdominal pain, blood in the stool, or fever that does not respond to Tylenol  or ibuprofen  she needs to return for reevaluation or be seen in the ER.  Work note provided.   Final Clinical Impressions(s) / UC Diagnoses   Final diagnoses:  COVID-19  Nausea without vomiting  Diarrhea, unspecified type     Discharge Instructions      CDC guidelines state that you must wear a mask for the first 5 days of symptoms when you are around other people.  After 5 days you no longer need to mask as you are no longer considered infectious.  There is no longer need to quarantine unless you have a fever.  If you do have a fever then you need  to quarantine until you have been fever free for 24 hours without taking Tylenol  and/or ibuprofen .  Use over-the-counter Tylenol  and/or ibuprofen  according to the package instructions as needed for fever and pain.  Use the 4 mg Zofran  tablets every 6 hours as needed for nausea.  Follow the list of food choices in your discharge packet to help alleviate your diarrhea.  As long as you are able to replace the fluid you are losing through your stool by mouth I would avoid using Imodium.  If your diarrhea stools increase in frequency and you feel you are unable to replace the fluid by mouth that you are losing through your bowels you may use over-the-counter Imodium.  Follow the package instructions for directions on dosing and frequency.  If you develop any sharp abdominal pain, increase in diarrhea stools or you are unable to control them with Imodium or replace the fluid by mouth, or develop blood in your stool you need to go to the ER for evaluation.  If you develop any worsening respiratory symptoms such as shortness of breath, shortness of breath at rest, feel as though you cannot catch your breath, you are unable to speak in full sentences, or, as a late sign, your lips begin turning blue you need to call 911 and go to the ER for evaluation.      ED Prescriptions     Medication Sig Dispense Auth. Provider   ondansetron  (ZOFRAN ) 4 MG tablet Take  1 tablet (4 mg total) by mouth every 6 (six) hours. 12 tablet Bernardino Ditch, NP      PDMP not reviewed this encounter.   Bernardino Ditch, NP 10/16/23 [phone]

## 2023-10-24 ENCOUNTER — Ambulatory Visit
Admission: EM | Admit: 2023-10-24 | Discharge: 2023-10-24 | Disposition: A | Discharge status: Home / Self Care | Attending: Family Medicine | Admitting: Family Medicine

## 2023-10-24 DIAGNOSIS — U071 COVID-19: Secondary | ICD-10-CM | POA: Diagnosis not present

## 2023-10-24 DIAGNOSIS — R110 Nausea: Secondary | ICD-10-CM | POA: Diagnosis not present

## 2023-10-24 LAB — SARS CORONAVIRUS 2 BY RT PCR: SARS Coronavirus 2 by RT PCR: POSITIVE — AB

## 2023-10-24 MED ORDER — PROMETHAZINE HCL 12.5 MG PO TABS: 12.5000 mg | ORAL_TABLET | Freq: Four times a day (QID) | ORAL | 0 refills | Status: AC | PRN

## 2023-10-24 NOTE — Discharge Instructions (Addendum)
 You COVID test is still positive. As discussed, we use a COVID PCR test which could remain positive for up to 30 days.

## 2023-10-24 NOTE — ED Triage Notes (Signed)
 Patient needs a covid test. Her employer wont accept a note until its says negative for covid.

## 2023-10-24 NOTE — ED Provider Notes (Signed)
 [facility]    CSN: [id number] Arrival date & time: 10/24/23  8147      History   Chief Complaint Chief Complaint  Patient presents with   covid test    HPI Kristi Li is a 38 y.o. female.   HPI  History obtained from the patient. [name] presents for COVID testing. Has an occasional cough, intermittent nausea but no vomiting or diarrhea. There has not been a recent fever. States that her job requires a negative COVID test to return to work.       Past Medical History:  Diagnosis Date   Pseudotumor cerebri    Vasovagal syncope     There are no active problems to display for this patient.   Past Surgical History:  Procedure Laterality Date   CSF SHUNT      OB History   No obstetric history on file.      Home Medications    Prior to Admission medications   Medication Sig Start Date End Date Taking? Authorizing Provider  promethazine  (PHENERGAN ) 12.5 MG tablet Take 1 tablet (12.5 mg total) by mouth every 6 (six) hours as needed for nausea or vomiting. 10/24/23  Yes Laruth Hanger, DO  albuterol  (VENTOLIN  HFA) 108 (90 Base) MCG/ACT inhaler Inhale 1-2 puffs into the lungs every 4 (four) hours as needed for wheezing or shortness of breath. 03/22/21   Van Knee, MD  carvedilol (COREG) 6.25 MG tablet Take 6.25 mg by mouth 2 (two) times daily.    [provider]  cyclobenzaprine  (FLEXERIL ) 10 MG tablet Take 1 tablet (10 mg total) by mouth 2 (two) times daily as needed for muscle spasms. 03/19/22   Teresa Shelba SAUNDERS, NP  fluticasone  (FLONASE ) 50 MCG/ACT nasal spray Place 2 sprays into both nostrils daily. 03/22/21   Van Knee, MD  ibuprofen  (ADVIL ) 800 MG tablet Take 1 tablet (800 mg total) by mouth 3 (three) times daily. 09/22/21   Myrna Camelia HERO, NP  levonorgestrel  (MIRENA ) 20 MCG/DAY IUD 1 each by Intrauterine route once.    [provider]  meloxicam  (MOBIC ) 15 MG tablet Take 1 tablet (15 mg total) by mouth daily. 03/19/22    White, Shelba SAUNDERS, NP  ondansetron  (ZOFRAN ) 4 MG tablet Take 1 tablet (4 mg total) by mouth every 6 (six) hours. 10/16/23   Bernardino Ditch, NP  predniSONE  (DELTASONE ) 20 MG tablet Take 2 tablets (40 mg total) by mouth daily. 03/19/22   Teresa Shelba SAUNDERS, NP  Spacer/Aero-Holding Chambers (AEROCHAMBER PLUS) inhaler Use with inhaler 03/22/21   Van Knee, MD  topiramate (TOPAMAX) 25 MG tablet Take 25 mg by mouth 2 (two) times daily. 06/22/20   [provider]    Family History Family History  Problem Relation Age of Onset   Other Mother    Sickle cell anemia Father     Social History Social History   Tobacco Use   Smoking status: Former    Current packs/day: 0.00    Types: Cigarettes    Quit date: 03/23/2019    Years since quitting: 4.5   Smokeless tobacco: Never  Vaping Use   Vaping status: Former   Start date: 02/19/2021  Substance Use Topics   Alcohol use: Yes    Comment: socially   Drug use: No     Allergies   Doxycycline   Review of Systems Review of Systems: negative unless otherwise stated in HPI.      Physical Exam Triage Vital Signs ED Triage Vitals  Encounter  Vitals Group     BP      Girls Systolic BP Percentile      Girls Diastolic BP Percentile      Boys Systolic BP Percentile      Boys Diastolic BP Percentile      Pulse      Resp      Temp      Temp src      SpO2      Weight      Height      Head Circumference      Peak Flow      Pain Score      Pain Loc      Pain Education      Exclude from Growth Chart    No data found.  Updated Vital Signs BP (!) 142/85 (BP Location: Right Arm)   Pulse 71   Temp 98.7 F (37.1 C) (Oral)   Resp 19   Wt 113.4 kg   SpO2 96%   BMI 44.29 kg/m   Visual Acuity Right Eye Distance:   Left Eye Distance:   Bilateral Distance:    Right Eye Near:   Left Eye Near:    Bilateral Near:     Physical Exam GEN:     alert, well appearing female in no distress    HENT:  mucus membranes moist, no asal  discharge  EYES:   no scleral injection or discharge NECK:  normal ROM RESP:  no increased work of breathing, clear to auscultation bilaterally CVS:   regular rate and rhythm Skin:   warm and dry, no rash on visible skin    UC Treatments / Results  Labs (all labs ordered are listed, but only abnormal results are displayed) Labs Reviewed  SARS CORONAVIRUS 2 BY RT PCR - Abnormal; Notable for the following components:      Result Value   SARS Coronavirus 2 by RT PCR POSITIVE (*)    All other components within normal limits    EKG   Radiology No results found.   Procedures Procedures (including critical care time)  Medications Ordered in UC Medications - No data to display  Initial Impression / Assessment and Plan / UC Course  I have reviewed the triage vital signs and the nursing notes.  Pertinent labs & imaging results that were available during my care of the patient were reviewed by me and considered in my medical decision making (see chart for details).       Pt is a 38 y.o. female who presents for COVID test. [name] is afebrile here without recent antipyretics. Satting well on room air. Overall pt is well appearing, well hydrated, without respiratory distress. She had a positive COVID test on 10/11/23 at the [facility]. She requests a COVID test so she can return to work. Pt advised that her COVID test may still be positive as we use a PCR test and not a antigen test. She says she needs the test to return to work. Given option to go to [facility] for testing but they told her that she didn't need a repeat test so she came here.   COVID PCR is still positive, as expected.  Work note provided.  CDC guidelines for COVID given for health care personnel.  She was prescribed phenergan  for nausea.    Return and ED precautions given and voiced understanding. Discussed MDM, treatment plan and plan for follow-up with patient who agrees with plan.  Final  Clinical Impressions(s) / UC Diagnoses   Final diagnoses:  Lab test positive for detection of COVID-19 virus  Nausea without vomiting     Discharge Instructions      You COVID test is still positive. As discussed, we use a COVID PCR test which could remain positive for up to 30 days.      ED Prescriptions     Medication Sig Dispense Auth. Provider   promethazine  (PHENERGAN ) 12.5 MG tablet Take 1 tablet (12.5 mg total) by mouth every 6 (six) hours as needed for nausea or vomiting. 20 tablet Lajuan Godbee, DO      PDMP not reviewed this encounter.   Gibran Veselka, DO 10/24/23 1959

## 2023-12-11 DIAGNOSIS — R001 Bradycardia, unspecified: Secondary | ICD-10-CM | POA: Diagnosis not present

## 2023-12-11 DIAGNOSIS — I491 Atrial premature depolarization: Secondary | ICD-10-CM | POA: Diagnosis not present

## 2023-12-11 DIAGNOSIS — G4733 Obstructive sleep apnea (adult) (pediatric): Secondary | ICD-10-CM | POA: Diagnosis not present

## 2023-12-11 DIAGNOSIS — R002 Palpitations: Secondary | ICD-10-CM | POA: Diagnosis not present

## 2023-12-11 DIAGNOSIS — R0601 Orthopnea: Secondary | ICD-10-CM | POA: Diagnosis not present

## 2023-12-11 DIAGNOSIS — R030 Elevated blood-pressure reading, without diagnosis of hypertension: Secondary | ICD-10-CM | POA: Diagnosis not present

## 2023-12-11 DIAGNOSIS — R600 Localized edema: Secondary | ICD-10-CM | POA: Diagnosis not present

## 2023-12-11 DIAGNOSIS — Z7189 Other specified counseling: Secondary | ICD-10-CM | POA: Diagnosis not present

## 2023-12-16 DIAGNOSIS — Z79899 Other long term (current) drug therapy: Secondary | ICD-10-CM | POA: Diagnosis not present

## 2023-12-16 DIAGNOSIS — R42 Dizziness and giddiness: Secondary | ICD-10-CM | POA: Diagnosis not present

## 2023-12-16 DIAGNOSIS — I10 Essential (primary) hypertension: Secondary | ICD-10-CM | POA: Diagnosis not present

## 2023-12-16 DIAGNOSIS — Z7985 Long-term (current) use of injectable non-insulin antidiabetic drugs: Secondary | ICD-10-CM | POA: Diagnosis not present

## 2023-12-19 DIAGNOSIS — F419 Anxiety disorder, unspecified: Secondary | ICD-10-CM | POA: Diagnosis not present

## 2023-12-19 DIAGNOSIS — M25512 Pain in left shoulder: Secondary | ICD-10-CM | POA: Diagnosis not present

## 2024-01-01 DIAGNOSIS — R002 Palpitations: Secondary | ICD-10-CM | POA: Diagnosis not present

## 2024-01-01 DIAGNOSIS — R001 Bradycardia, unspecified: Secondary | ICD-10-CM | POA: Diagnosis not present

## 2024-01-02 DIAGNOSIS — R002 Palpitations: Secondary | ICD-10-CM | POA: Diagnosis not present

## 2024-01-02 DIAGNOSIS — I491 Atrial premature depolarization: Secondary | ICD-10-CM | POA: Diagnosis not present

## 2024-01-02 DIAGNOSIS — R001 Bradycardia, unspecified: Secondary | ICD-10-CM | POA: Diagnosis not present
